# Patient Record
Sex: Female | Born: 1935 | Race: White | Hispanic: No | Smoking: Never smoker
Health system: Southern US, Community
[De-identification: ages and names within clinical notes are randomized; demographics above are authoritative.]

## PROBLEM LIST (undated history)

## (undated) DIAGNOSIS — I10 Essential (primary) hypertension: Secondary | ICD-10-CM

## (undated) HISTORY — PX: TONSILLECTOMY: SUR1361

## (undated) HISTORY — PX: ABDOMINAL HYSTERECTOMY: SHX81

## (undated) HISTORY — PX: CHOLECYSTECTOMY: SHX55

---

## 2004-03-21 ENCOUNTER — Other Ambulatory Visit: Payer: Self-pay

## 2004-08-14 ENCOUNTER — Ambulatory Visit: Payer: Self-pay | Admitting: Internal Medicine

## 2004-09-04 ENCOUNTER — Ambulatory Visit: Payer: Self-pay | Admitting: Anesthesiology

## 2004-09-11 ENCOUNTER — Ambulatory Visit: Payer: Self-pay | Admitting: Internal Medicine

## 2004-10-11 ENCOUNTER — Ambulatory Visit: Payer: Self-pay | Admitting: Internal Medicine

## 2004-11-11 ENCOUNTER — Ambulatory Visit: Payer: Self-pay | Admitting: Internal Medicine

## 2004-11-25 ENCOUNTER — Emergency Department: Payer: Self-pay | Admitting: Emergency Medicine

## 2004-12-17 ENCOUNTER — Ambulatory Visit: Payer: Self-pay | Admitting: Internal Medicine

## 2004-12-24 ENCOUNTER — Ambulatory Visit: Payer: Self-pay | Admitting: Anesthesiology

## 2005-01-22 ENCOUNTER — Ambulatory Visit: Payer: Self-pay | Admitting: Anesthesiology

## 2005-01-23 ENCOUNTER — Ambulatory Visit: Payer: Self-pay | Admitting: Internal Medicine

## 2005-02-20 ENCOUNTER — Ambulatory Visit: Payer: Self-pay | Admitting: Internal Medicine

## 2005-02-26 ENCOUNTER — Ambulatory Visit: Payer: Self-pay | Admitting: Anesthesiology

## 2005-03-20 ENCOUNTER — Ambulatory Visit: Payer: Self-pay | Admitting: Internal Medicine

## 2005-03-21 ENCOUNTER — Ambulatory Visit: Payer: Self-pay | Admitting: Anesthesiology

## 2005-04-11 ENCOUNTER — Ambulatory Visit: Payer: Self-pay | Admitting: Internal Medicine

## 2005-05-11 ENCOUNTER — Ambulatory Visit: Payer: Self-pay | Admitting: Internal Medicine

## 2005-06-12 ENCOUNTER — Ambulatory Visit: Payer: Self-pay | Admitting: Internal Medicine

## 2005-06-13 ENCOUNTER — Ambulatory Visit: Payer: Self-pay | Admitting: Anesthesiology

## 2005-06-15 ENCOUNTER — Emergency Department: Payer: Self-pay | Admitting: Emergency Medicine

## 2005-07-12 ENCOUNTER — Ambulatory Visit: Payer: Self-pay | Admitting: Internal Medicine

## 2005-08-06 ENCOUNTER — Inpatient Hospital Stay: Payer: Self-pay | Admitting: Internal Medicine

## 2005-08-29 ENCOUNTER — Ambulatory Visit: Payer: Self-pay | Admitting: Internal Medicine

## 2005-09-10 ENCOUNTER — Ambulatory Visit: Payer: Self-pay | Admitting: Anesthesiology

## 2005-09-11 ENCOUNTER — Ambulatory Visit: Payer: Self-pay | Admitting: Internal Medicine

## 2005-10-11 ENCOUNTER — Ambulatory Visit: Payer: Self-pay | Admitting: Internal Medicine

## 2005-11-20 ENCOUNTER — Ambulatory Visit: Payer: Self-pay | Admitting: Anesthesiology

## 2005-11-27 ENCOUNTER — Ambulatory Visit: Payer: Self-pay | Admitting: Internal Medicine

## 2005-12-01 ENCOUNTER — Emergency Department: Payer: Self-pay | Admitting: Internal Medicine

## 2005-12-01 ENCOUNTER — Other Ambulatory Visit: Payer: Self-pay

## 2005-12-12 ENCOUNTER — Ambulatory Visit: Payer: Self-pay | Admitting: Internal Medicine

## 2006-04-03 ENCOUNTER — Ambulatory Visit: Payer: Self-pay | Admitting: Anesthesiology

## 2006-04-04 ENCOUNTER — Ambulatory Visit: Payer: Self-pay | Admitting: Anesthesiology

## 2006-04-07 ENCOUNTER — Ambulatory Visit: Payer: Self-pay | Admitting: Internal Medicine

## 2006-04-11 ENCOUNTER — Ambulatory Visit: Payer: Self-pay | Admitting: Internal Medicine

## 2006-05-08 ENCOUNTER — Ambulatory Visit: Payer: Self-pay | Admitting: Anesthesiology

## 2006-05-11 ENCOUNTER — Ambulatory Visit: Payer: Self-pay | Admitting: Internal Medicine

## 2006-06-11 ENCOUNTER — Ambulatory Visit: Payer: Self-pay | Admitting: Internal Medicine

## 2006-06-29 ENCOUNTER — Other Ambulatory Visit: Payer: Self-pay

## 2006-06-29 ENCOUNTER — Inpatient Hospital Stay: Payer: Self-pay | Admitting: Internal Medicine

## 2006-07-10 ENCOUNTER — Ambulatory Visit: Payer: Self-pay | Admitting: Anesthesiology

## 2006-07-30 ENCOUNTER — Ambulatory Visit: Payer: Self-pay | Admitting: Internal Medicine

## 2006-08-12 ENCOUNTER — Ambulatory Visit: Payer: Self-pay | Admitting: Anesthesiology

## 2006-09-01 ENCOUNTER — Ambulatory Visit: Payer: Self-pay | Admitting: Internal Medicine

## 2006-09-02 ENCOUNTER — Ambulatory Visit: Payer: Self-pay | Admitting: Anesthesiology

## 2006-09-11 ENCOUNTER — Ambulatory Visit: Payer: Self-pay | Admitting: Internal Medicine

## 2006-10-06 ENCOUNTER — Ambulatory Visit: Payer: Self-pay | Admitting: Anesthesiology

## 2006-10-22 ENCOUNTER — Ambulatory Visit: Payer: Self-pay | Admitting: Internal Medicine

## 2006-11-19 ENCOUNTER — Ambulatory Visit: Payer: Self-pay | Admitting: Internal Medicine

## 2006-11-20 ENCOUNTER — Ambulatory Visit: Payer: Self-pay | Admitting: Anesthesiology

## 2006-12-11 ENCOUNTER — Ambulatory Visit: Payer: Self-pay | Admitting: Anesthesiology

## 2006-12-12 ENCOUNTER — Ambulatory Visit: Payer: Self-pay | Admitting: Internal Medicine

## 2007-01-10 ENCOUNTER — Ambulatory Visit: Payer: Self-pay | Admitting: Internal Medicine

## 2007-02-18 ENCOUNTER — Ambulatory Visit: Payer: Self-pay | Admitting: Internal Medicine

## 2007-03-05 ENCOUNTER — Ambulatory Visit: Payer: Self-pay | Admitting: Anesthesiology

## 2007-03-05 ENCOUNTER — Emergency Department: Payer: Self-pay | Admitting: Emergency Medicine

## 2007-03-24 ENCOUNTER — Ambulatory Visit: Payer: Self-pay | Admitting: Podiatry

## 2007-03-25 ENCOUNTER — Ambulatory Visit: Payer: Self-pay | Admitting: Internal Medicine

## 2007-04-15 ENCOUNTER — Ambulatory Visit: Payer: Self-pay | Admitting: Internal Medicine

## 2007-05-08 ENCOUNTER — Ambulatory Visit: Payer: Self-pay | Admitting: Gastroenterology

## 2007-05-12 ENCOUNTER — Ambulatory Visit: Payer: Self-pay | Admitting: Internal Medicine

## 2007-06-12 ENCOUNTER — Ambulatory Visit: Payer: Self-pay | Admitting: Internal Medicine

## 2007-07-13 ENCOUNTER — Ambulatory Visit: Payer: Self-pay | Admitting: Internal Medicine

## 2007-08-17 ENCOUNTER — Ambulatory Visit: Payer: Self-pay | Admitting: Anesthesiology

## 2007-08-28 ENCOUNTER — Ambulatory Visit: Payer: Self-pay | Admitting: Internal Medicine

## 2007-09-12 ENCOUNTER — Ambulatory Visit: Payer: Self-pay | Admitting: Internal Medicine

## 2007-10-12 ENCOUNTER — Ambulatory Visit: Payer: Self-pay | Admitting: Internal Medicine

## 2007-10-28 ENCOUNTER — Ambulatory Visit: Payer: Self-pay | Admitting: Anesthesiology

## 2007-11-12 ENCOUNTER — Ambulatory Visit: Payer: Self-pay | Admitting: Internal Medicine

## 2007-11-25 ENCOUNTER — Ambulatory Visit: Payer: Self-pay | Admitting: Internal Medicine

## 2007-12-13 ENCOUNTER — Ambulatory Visit: Payer: Self-pay | Admitting: Internal Medicine

## 2007-12-29 ENCOUNTER — Ambulatory Visit: Payer: Self-pay | Admitting: Anesthesiology

## 2008-01-10 ENCOUNTER — Ambulatory Visit: Payer: Self-pay | Admitting: Internal Medicine

## 2008-02-19 ENCOUNTER — Ambulatory Visit: Payer: Self-pay | Admitting: Family Medicine

## 2008-02-20 ENCOUNTER — Ambulatory Visit: Payer: Self-pay | Admitting: Family Medicine

## 2008-02-24 ENCOUNTER — Ambulatory Visit: Payer: Self-pay | Admitting: Internal Medicine

## 2008-03-09 ENCOUNTER — Other Ambulatory Visit: Payer: Self-pay

## 2008-03-09 ENCOUNTER — Ambulatory Visit: Payer: Self-pay | Admitting: Ophthalmology

## 2008-03-14 ENCOUNTER — Ambulatory Visit: Payer: Self-pay | Admitting: Ophthalmology

## 2008-03-23 ENCOUNTER — Ambulatory Visit: Payer: Self-pay | Admitting: Internal Medicine

## 2008-04-06 ENCOUNTER — Ambulatory Visit: Payer: Self-pay | Admitting: Ophthalmology

## 2008-04-18 ENCOUNTER — Ambulatory Visit: Payer: Self-pay | Admitting: Ophthalmology

## 2008-05-03 ENCOUNTER — Ambulatory Visit: Payer: Self-pay | Admitting: Internal Medicine

## 2008-05-11 ENCOUNTER — Ambulatory Visit: Payer: Self-pay | Admitting: Internal Medicine

## 2008-06-11 ENCOUNTER — Ambulatory Visit: Payer: Self-pay | Admitting: Internal Medicine

## 2008-06-13 ENCOUNTER — Ambulatory Visit: Payer: Self-pay | Admitting: Anesthesiology

## 2008-07-06 ENCOUNTER — Ambulatory Visit: Payer: Self-pay | Admitting: Internal Medicine

## 2008-07-22 ENCOUNTER — Ambulatory Visit: Payer: Self-pay | Admitting: Anesthesiology

## 2008-08-03 ENCOUNTER — Ambulatory Visit: Payer: Self-pay | Admitting: Internal Medicine

## 2008-08-11 ENCOUNTER — Ambulatory Visit: Payer: Self-pay | Admitting: Internal Medicine

## 2008-09-11 ENCOUNTER — Ambulatory Visit: Payer: Self-pay | Admitting: Internal Medicine

## 2008-10-22 ENCOUNTER — Emergency Department: Payer: Self-pay | Admitting: Emergency Medicine

## 2008-11-02 ENCOUNTER — Ambulatory Visit: Payer: Self-pay | Admitting: Internal Medicine

## 2008-11-11 ENCOUNTER — Ambulatory Visit: Payer: Self-pay | Admitting: Internal Medicine

## 2008-11-23 ENCOUNTER — Ambulatory Visit: Payer: Self-pay | Admitting: Internal Medicine

## 2008-11-25 ENCOUNTER — Ambulatory Visit: Payer: Self-pay | Admitting: Anesthesiology

## 2008-12-12 ENCOUNTER — Ambulatory Visit: Payer: Self-pay | Admitting: Internal Medicine

## 2009-01-09 ENCOUNTER — Ambulatory Visit: Payer: Self-pay | Admitting: Internal Medicine

## 2009-02-15 ENCOUNTER — Ambulatory Visit: Payer: Self-pay | Admitting: Internal Medicine

## 2009-02-17 ENCOUNTER — Ambulatory Visit: Payer: Self-pay | Admitting: Urology

## 2009-02-17 ENCOUNTER — Encounter: Payer: Self-pay | Admitting: Gastroenterology

## 2009-03-11 ENCOUNTER — Ambulatory Visit: Payer: Self-pay | Admitting: Internal Medicine

## 2009-03-29 ENCOUNTER — Ambulatory Visit: Payer: Self-pay | Admitting: Internal Medicine

## 2009-04-05 ENCOUNTER — Ambulatory Visit: Payer: Self-pay | Admitting: Anesthesiology

## 2009-04-07 ENCOUNTER — Encounter: Payer: Self-pay | Admitting: Gastroenterology

## 2009-04-11 ENCOUNTER — Ambulatory Visit: Payer: Self-pay | Admitting: Internal Medicine

## 2009-04-18 ENCOUNTER — Encounter: Payer: Self-pay | Admitting: Gastroenterology

## 2009-04-18 ENCOUNTER — Telehealth (INDEPENDENT_AMBULATORY_CARE_PROVIDER_SITE_OTHER): Payer: Self-pay | Admitting: *Deleted

## 2009-04-18 DIAGNOSIS — R933 Abnormal findings on diagnostic imaging of other parts of digestive tract: Secondary | ICD-10-CM | POA: Insufficient documentation

## 2009-05-04 ENCOUNTER — Ambulatory Visit: Payer: Self-pay | Admitting: Gastroenterology

## 2009-05-04 ENCOUNTER — Ambulatory Visit (HOSPITAL_COMMUNITY): Admission: RE | Admit: 2009-05-04 | Discharge: 2009-05-04 | Payer: Self-pay | Admitting: Gastroenterology

## 2009-05-17 ENCOUNTER — Ambulatory Visit: Payer: Self-pay | Admitting: Internal Medicine

## 2009-06-06 ENCOUNTER — Ambulatory Visit: Payer: Self-pay | Admitting: Gastroenterology

## 2009-06-11 ENCOUNTER — Ambulatory Visit: Payer: Self-pay | Admitting: Internal Medicine

## 2009-06-13 ENCOUNTER — Inpatient Hospital Stay: Payer: Self-pay | Admitting: Internal Medicine

## 2009-06-22 ENCOUNTER — Inpatient Hospital Stay: Payer: Self-pay | Admitting: Internal Medicine

## 2009-07-07 ENCOUNTER — Ambulatory Visit: Payer: Self-pay | Admitting: Family Medicine

## 2009-07-18 ENCOUNTER — Encounter: Admission: RE | Admit: 2009-07-18 | Discharge: 2009-07-18 | Payer: Self-pay | Admitting: Gastroenterology

## 2009-07-26 ENCOUNTER — Inpatient Hospital Stay (HOSPITAL_COMMUNITY): Admission: RE | Admit: 2009-07-26 | Discharge: 2009-07-28 | Payer: Self-pay | Admitting: Gastroenterology

## 2010-11-19 ENCOUNTER — Inpatient Hospital Stay: Payer: Self-pay | Admitting: Surgery

## 2010-12-13 ENCOUNTER — Ambulatory Visit: Payer: Self-pay | Admitting: Surgery

## 2011-02-15 LAB — COMPREHENSIVE METABOLIC PANEL
ALT: 9 U/L (ref 0–35)
Alkaline Phosphatase: 57 U/L (ref 39–117)
Alkaline Phosphatase: 73 U/L (ref 39–117)
BUN: 3 mg/dL — ABNORMAL LOW (ref 6–23)
BUN: 10 mg/dL (ref 6–23)
BUN: 12 mg/dL (ref 6–23)
CO2: 24 mEq/L (ref 19–32)
Calcium: 8.5 mg/dL (ref 8.4–10.5)
Calcium: 8 mg/dL — ABNORMAL LOW (ref 8.4–10.5)
Chloride: 100 mEq/L (ref 96–112)
Creatinine, Ser: 0.53 mg/dL (ref 0.4–1.2)
GFR calc non Af Amer: >60 mL/min (ref >60)
Glucose, Bld: 106 mg/dL — ABNORMAL HIGH (ref 70–99)
Glucose, Bld: 102 mg/dL — ABNORMAL HIGH (ref 70–99)
Glucose, Bld: 116 mg/dL — ABNORMAL HIGH (ref 70–99)
Potassium: 3.7 mEq/L (ref 3.5–5.1)
Potassium: 4.4 mEq/L (ref 3.5–5.1)
Sodium: 135 mEq/L (ref 135–145)
Total Bilirubin: 0.6 mg/dL (ref 0.3–1.2)
Total Protein: 5.2 g/dL — ABNORMAL LOW (ref 6.0–8.3)

## 2011-02-15 LAB — LIPASE, BLOOD
Lipase: 132 U/L — ABNORMAL HIGH (ref 11–59)
Lipase: 825 U/L — ABNORMAL HIGH (ref 11–59)

## 2011-02-15 LAB — BASIC METABOLIC PANEL
BUN: 4 mg/dL — ABNORMAL LOW (ref 6–23)
GFR calc Af Amer: >60 mL/min (ref >60)
GFR calc non Af Amer: >60 mL/min (ref >60)
Potassium: 3.9 mEq/L (ref 3.5–5.1)

## 2011-02-15 LAB — CBC
HCT: 31.4 % — ABNORMAL LOW (ref 36.0–46.0)
HCT: 31.9 % — ABNORMAL LOW (ref 36.0–46.0)
HCT: 32.7 % — ABNORMAL LOW (ref 36.0–46.0)
HCT: 37.7 % (ref 36.0–46.0)
Hemoglobin: 10.7 g/dL — ABNORMAL LOW (ref 12.0–15.0)
Hemoglobin: 11.1 g/dL — ABNORMAL LOW (ref 12.0–15.0)
Hemoglobin: 12.6 g/dL (ref 12.0–15.0)
MCHC: 33.4 g/dL (ref 30.0–36.0)
MCHC: 33.8 g/dL (ref 30.0–36.0)
MCV: 95.5 fL (ref 78.0–100.0)
Platelets: 236 10*3/uL (ref 150–400)
RBC: 3.34 MIL/uL — ABNORMAL LOW (ref 3.87–5.11)
RDW: 14.6 % (ref 11.5–15.5)
WBC: 9.6 10*3/uL (ref 4.0–10.5)
WBC: 5.9 10*3/uL (ref 4.0–10.5)

## 2011-02-15 LAB — PROTIME-INR
INR: 0.9 (ref 0.00–1.49)
Prothrombin Time: 11.9 seconds (ref 11.6–15.2)

## 2011-03-27 ENCOUNTER — Ambulatory Visit: Payer: Self-pay | Admitting: Anesthesiology

## 2011-07-01 ENCOUNTER — Ambulatory Visit: Payer: Self-pay | Admitting: Anesthesiology

## 2011-07-29 ENCOUNTER — Ambulatory Visit: Payer: Self-pay | Admitting: Anesthesiology

## 2011-09-11 ENCOUNTER — Ambulatory Visit: Payer: Self-pay | Admitting: Gastroenterology

## 2011-10-07 ENCOUNTER — Ambulatory Visit: Payer: Self-pay | Admitting: Anesthesiology

## 2012-07-11 ENCOUNTER — Ambulatory Visit: Payer: Self-pay | Admitting: Internal Medicine

## 2012-07-15 ENCOUNTER — Other Ambulatory Visit: Payer: Self-pay | Admitting: Podiatry

## 2012-07-19 LAB — WOUND CULTURE

## 2012-10-19 ENCOUNTER — Ambulatory Visit: Payer: Self-pay

## 2013-05-18 ENCOUNTER — Ambulatory Visit: Payer: Self-pay | Admitting: Pain Medicine

## 2013-05-26 ENCOUNTER — Ambulatory Visit: Payer: Self-pay | Admitting: Pain Medicine

## 2013-06-10 ENCOUNTER — Ambulatory Visit: Payer: Self-pay | Admitting: Pain Medicine

## 2013-06-22 ENCOUNTER — Ambulatory Visit: Payer: Self-pay | Admitting: Pain Medicine

## 2013-07-05 ENCOUNTER — Ambulatory Visit: Payer: Self-pay | Admitting: Pain Medicine

## 2013-08-03 ENCOUNTER — Ambulatory Visit: Payer: Self-pay | Admitting: Pain Medicine

## 2013-08-18 ENCOUNTER — Ambulatory Visit: Payer: Self-pay | Admitting: Pain Medicine

## 2013-09-07 ENCOUNTER — Ambulatory Visit: Payer: Self-pay | Admitting: Pain Medicine

## 2013-09-15 ENCOUNTER — Ambulatory Visit: Payer: Self-pay | Admitting: Pain Medicine

## 2013-10-05 ENCOUNTER — Ambulatory Visit: Payer: Self-pay | Admitting: Pain Medicine

## 2013-12-07 ENCOUNTER — Ambulatory Visit: Payer: Self-pay | Admitting: Pain Medicine

## 2013-12-22 ENCOUNTER — Ambulatory Visit: Payer: Self-pay | Admitting: Pain Medicine

## 2015-11-27 ENCOUNTER — Emergency Department: Payer: Medicare Other

## 2015-11-27 ENCOUNTER — Inpatient Hospital Stay
Admission: EM | Admit: 2015-11-27 | Discharge: 2015-11-29 | DRG: 065 | Disposition: A | Discharge status: Skilled Nursing Facility | Payer: Medicare Other | Attending: Specialist | Admitting: Specialist

## 2015-11-27 ENCOUNTER — Inpatient Hospital Stay: Payer: Medicare Other

## 2015-11-27 ENCOUNTER — Ambulatory Visit (INDEPENDENT_AMBULATORY_CARE_PROVIDER_SITE_OTHER)
Admission: EM | Admit: 2015-11-27 | Discharge: 2015-11-27 | Disposition: A | Discharge status: Other Acute Inpatient Hospital | Payer: Medicare Other | Source: Home / Self Care

## 2015-11-27 ENCOUNTER — Encounter: Payer: Self-pay | Admitting: Emergency Medicine

## 2015-11-27 ENCOUNTER — Inpatient Hospital Stay
Admit: 2015-11-27 | Discharge: 2015-11-27 | Disposition: A | Discharge status: Home / Self Care | Payer: Medicare Other | Attending: Internal Medicine | Admitting: Internal Medicine

## 2015-11-27 DIAGNOSIS — Z79899 Other long term (current) drug therapy: Secondary | ICD-10-CM | POA: Diagnosis not present

## 2015-11-27 DIAGNOSIS — Z90710 Acquired absence of both cervix and uterus: Secondary | ICD-10-CM | POA: Diagnosis not present

## 2015-11-27 DIAGNOSIS — Z8249 Family history of ischemic heart disease and other diseases of the circulatory system: Secondary | ICD-10-CM | POA: Diagnosis not present

## 2015-11-27 DIAGNOSIS — E875 Hyperkalemia: Secondary | ICD-10-CM | POA: Diagnosis present

## 2015-11-27 DIAGNOSIS — Z880 Allergy status to penicillin: Secondary | ICD-10-CM

## 2015-11-27 DIAGNOSIS — Z9889 Other specified postprocedural states: Secondary | ICD-10-CM | POA: Diagnosis not present

## 2015-11-27 DIAGNOSIS — M069 Rheumatoid arthritis, unspecified: Secondary | ICD-10-CM | POA: Diagnosis not present

## 2015-11-27 DIAGNOSIS — E785 Hyperlipidemia, unspecified: Secondary | ICD-10-CM | POA: Diagnosis not present

## 2015-11-27 DIAGNOSIS — Z23 Encounter for immunization: Secondary | ICD-10-CM

## 2015-11-27 DIAGNOSIS — I63519 Cerebral infarction due to unspecified occlusion or stenosis of unspecified middle cerebral artery: Secondary | ICD-10-CM | POA: Diagnosis present

## 2015-11-27 DIAGNOSIS — E860 Dehydration: Secondary | ICD-10-CM | POA: Diagnosis present

## 2015-11-27 DIAGNOSIS — F419 Anxiety disorder, unspecified: Secondary | ICD-10-CM | POA: Diagnosis present

## 2015-11-27 DIAGNOSIS — I639 Cerebral infarction, unspecified: Secondary | ICD-10-CM

## 2015-11-27 DIAGNOSIS — E871 Hypo-osmolality and hyponatremia: Secondary | ICD-10-CM | POA: Diagnosis present

## 2015-11-27 DIAGNOSIS — Z9049 Acquired absence of other specified parts of digestive tract: Secondary | ICD-10-CM | POA: Diagnosis not present

## 2015-11-27 DIAGNOSIS — R29818 Other symptoms and signs involving the nervous system: Secondary | ICD-10-CM | POA: Diagnosis not present

## 2015-11-27 DIAGNOSIS — R531 Weakness: Secondary | ICD-10-CM | POA: Diagnosis present

## 2015-11-27 DIAGNOSIS — R000 Tachycardia, unspecified: Secondary | ICD-10-CM | POA: Diagnosis not present

## 2015-11-27 DIAGNOSIS — I10 Essential (primary) hypertension: Secondary | ICD-10-CM | POA: Diagnosis present

## 2015-11-27 DIAGNOSIS — R29898 Other symptoms and signs involving the musculoskeletal system: Secondary | ICD-10-CM

## 2015-11-27 DIAGNOSIS — M810 Age-related osteoporosis without current pathological fracture: Secondary | ICD-10-CM | POA: Diagnosis present

## 2015-11-27 DIAGNOSIS — K219 Gastro-esophageal reflux disease without esophagitis: Secondary | ICD-10-CM | POA: Diagnosis present

## 2015-11-27 DIAGNOSIS — M6289 Other specified disorders of muscle: Secondary | ICD-10-CM | POA: Diagnosis present

## 2015-11-27 HISTORY — DX: Essential (primary) hypertension: I10

## 2015-11-27 LAB — URINALYSIS COMPLETE WITH MICROSCOPIC (ARMC ONLY)
Bilirubin Urine: NEGATIVE
Glucose, UA: NEGATIVE mg/dL
Hgb urine dipstick: NEGATIVE
Ketones, ur: NEGATIVE mg/dL
Nitrite: POSITIVE — AB
PROTEIN: NEGATIVE mg/dL
Specific Gravity, Urine: 1.015 (ref 1.005–1.030)
pH: 6 (ref 5.0–8.0)

## 2015-11-27 LAB — CBC
HEMATOCRIT: 39 % (ref 35.0–47.0)
HEMOGLOBIN: 12.9 g/dL (ref 12.0–16.0)
MCH: 30.8 pg (ref 26.0–34.0)
MCHC: 33 g/dL (ref 32.0–36.0)
MCV: 93.6 fL (ref 80.0–100.0)
Platelets: 266 10*3/uL (ref 150–440)
RBC: 4.17 MIL/uL (ref 3.80–5.20)
RDW: 13.2 % (ref 11.5–14.5)
WBC: 9.2 10*3/uL (ref 3.6–11.0)

## 2015-11-27 LAB — BASIC METABOLIC PANEL
ANION GAP: 7 (ref 5–15)
BUN: 20 mg/dL (ref 6–20)
CALCIUM: 8.9 mg/dL (ref 8.9–10.3)
CO2: 25 mmol/L (ref 22–32)
Chloride: 94 mmol/L — ABNORMAL LOW (ref 101–111)
Creatinine, Ser: 0.87 mg/dL (ref 0.44–1.00)
GFR calc Af Amer: >60 mL/min (ref >60)
GFR calc non Af Amer: >60 mL/min (ref >60)
GLUCOSE: 110 mg/dL — AB (ref 65–99)
Potassium: 5.5 mmol/L — ABNORMAL HIGH (ref 3.5–5.1)
Sodium: 126 mmol/L — ABNORMAL LOW (ref 135–145)

## 2015-11-27 MED ORDER — PNEUMOCOCCAL VAC POLYVALENT 25 MCG/0.5ML IJ INJ
0.5000 mL | INJECTION | INTRAMUSCULAR | Status: AC
  Administered 2015-11-28: 0.5 mL via INTRAMUSCULAR
  Filled 2015-11-27: qty 0.5

## 2015-11-27 MED ORDER — ONDANSETRON HCL 4 MG PO TABS: 4.0000 mg | ORAL_TABLET | Freq: Four times a day (QID) | ORAL | Status: DC | PRN

## 2015-11-27 MED ORDER — ALPRAZOLAM 0.25 MG PO TABS
0.2500 mg | ORAL_TABLET | Freq: Every day | ORAL | Status: DC | PRN
  Administered 2015-11-27 – 2015-11-28 (×2): 0.25 mg via ORAL
  Filled 2015-11-27 (×3): qty 1

## 2015-11-27 MED ORDER — NYSTATIN 100000 UNIT/GM EX POWD
Freq: Two times a day (BID) | CUTANEOUS | Status: DC
  Administered 2015-11-27 – 2015-11-29 (×4): via TOPICAL
  Filled 2015-11-27: qty 15

## 2015-11-27 MED ORDER — ONDANSETRON HCL 4 MG/2ML IJ SOLN: 4.0000 mg | Freq: Four times a day (QID) | INTRAMUSCULAR | Status: DC | PRN

## 2015-11-27 MED ORDER — ACETAMINOPHEN 650 MG RE SUPP: 650.0000 mg | Freq: Four times a day (QID) | RECTAL | Status: DC | PRN

## 2015-11-27 MED ORDER — ASPIRIN 81 MG PO CHEW
81.0000 mg | CHEWABLE_TABLET | Freq: Every day | ORAL | Status: DC
  Administered 2015-11-27 – 2015-11-29 (×3): 81 mg via ORAL
  Filled 2015-11-27 (×3): qty 1

## 2015-11-27 MED ORDER — ACETAMINOPHEN 325 MG PO TABS
650.0000 mg | ORAL_TABLET | Freq: Four times a day (QID) | ORAL | Status: DC | PRN
  Administered 2015-11-27 – 2015-11-28 (×4): 650 mg via ORAL
  Filled 2015-11-27 (×5): qty 2

## 2015-11-27 MED ORDER — PANTOPRAZOLE SODIUM 40 MG PO TBEC
40.0000 mg | DELAYED_RELEASE_TABLET | Freq: Every day | ORAL | Status: DC
  Administered 2015-11-27 – 2015-11-29 (×3): 40 mg via ORAL
  Filled 2015-11-27 (×3): qty 1

## 2015-11-27 MED ORDER — HEPARIN SODIUM (PORCINE) 5000 UNIT/ML IJ SOLN
5000.0000 [IU] | Freq: Three times a day (TID) | INTRAMUSCULAR | Status: DC
  Administered 2015-11-27 – 2015-11-28 (×4): 5000 [IU] via SUBCUTANEOUS
  Filled 2015-11-27 (×3): qty 1

## 2015-11-27 MED ORDER — ZOLPIDEM TARTRATE 5 MG PO TABS
5.0000 mg | ORAL_TABLET | Freq: Every day | ORAL | Status: DC
  Administered 2015-11-27 – 2015-11-28 (×2): 5 mg via ORAL
  Filled 2015-11-27 (×3): qty 1

## 2015-11-27 MED ORDER — SODIUM CHLORIDE 0.9 % IV SOLN
INTRAVENOUS | Status: DC
  Administered 2015-11-27 – 2015-11-28 (×2): via INTRAVENOUS

## 2015-11-27 MED ORDER — SODIUM POLYSTYRENE SULFONATE 15 GM/60ML PO SUSP
30.0000 g | Freq: Once | ORAL | Status: AC
  Administered 2015-11-27: 30 g via ORAL
  Filled 2015-11-27: qty 120

## 2015-11-27 MED ORDER — METOCLOPRAMIDE HCL 10 MG PO TABS
10.0000 mg | ORAL_TABLET | Freq: Three times a day (TID) | ORAL | Status: DC
  Administered 2015-11-27 – 2015-11-29 (×7): 10 mg via ORAL
  Filled 2015-11-27 (×7): qty 1

## 2015-11-27 MED ORDER — NITROGLYCERIN 0.4 MG SL SUBL: 0.4000 mg | SUBLINGUAL_TABLET | SUBLINGUAL | Status: DC | PRN

## 2015-11-27 NOTE — Progress Notes (Signed)
*  PRELIMINARY RESULTS* Echocardiogram 2D Echocardiogram has been performed.  Erica Villa 11/27/2015, 8:03 PM

## 2015-11-27 NOTE — H&P (Signed)
[facility] [place] [facility]   PATIENT NAME: Erica Villa    MR#:  [id number]  DATE OF BIRTH:  1932-05-01  DATE OF ADMISSION:  11/27/2015  PRIMARY CARE PHYSICIAN: No primary care provider on file.   REQUESTING/REFERRING PHYSICIAN: Dr. Huel Cote  CHIEF COMPLAINT: Right-sided weakness    Chief Complaint  Patient presents with  . Numbness    HISTORY OF PRESENT ILLNESS:  Erica Villa  is a 82 y.o. female with a known history of hypertension brought in by family secondary to right-sided weakness started yesterday morning. Patient to noted to have right-sided weakness mainly in the upper extremity and lower extremity continued today also. No slurred speech. No headache or double vision. No weakness  o n the left side. Earlier had numbness on the right side but resolved. Noted to have hyponatremia. PAST MEDICAL HISTORY:   Past Medical History  Diagnosis Date  . Hypertension    history of rheumatoid arthritis.  PAST SURGICAL HISTOIRY:   Past Surgical History  Procedure Laterality Date  . Abdominal hysterectomy    . Tonsillectomy    . Cholecystectomy      SOCIAL HISTORY:   Social History  Substance Use Topics  . Smoking status: Never Smoker   . Smokeless tobacco: Never Used  . Alcohol Use: No    FAMILY HISTORY:  No family history on file. Has history of hypertension in brother and sister. DRUG ALLERGIES:   Allergies  Allergen Reactions  . Penicillins Anaphylaxis and Other (See Comments)    Has patient had a PCN reaction causing immediate rash, facial/tongue/throat swelling, SOB or lightheadedness with hypotension: Yes Has patient had a PCN reaction causing severe rash involving mucus membranes or skin necrosis: No Has patient had a PCN reaction that required hospitalization No Has patient had a PCN reaction occurring within the last 10 years: No If all of the above answers are "NO", then may proceed with Cephalosporin use.    REVIEW OF  SYSTEMS:  CONSTITUTIONAL: No fever, fatigue or weakness.  EYES: No blurred or double vision.  EARS, NOSE, AND THROAT: No tinnitus or ear pain.  RESPIRATORY: No cough, shortness of breath, wheezing or hemoptysis.  CARDIOVASCULAR: No chest pain, orthopnea, edema.  GASTROINTESTINAL: No nausea, vomiting, diarrhea or abdominal pain.  GENITOURINARY: No dysuria, hematuria.  ENDOCRINE: No polyuria, nocturia,  HEMATOLOGY: No anemia, easy bruising or bleeding SKIN: No rash or lesion. MUSCULOSKELETAL:Right-sided weakness.  NEUROLOGIC: No tingling, numbness, weakness.  PSYCHIATRY: No anxiety or depression.   MEDICATIONS AT HOME:   Prior to Admission medications   Medication Sig Start Date End Date Taking? Authorizing Provider  ALPRAZolam Prudy Feeler) 0.25 MG tablet Take 0.25 mg by mouth daily as needed for anxiety.    Yes Historical Provider, MD  denosumab (PROLIA) 60 MG/ML SOLN injection Inject 60 mg into the skin every 6 (six) months.    Yes Historical Provider, MD  isosorbide mononitrate (IMDUR) 30 MG 24 hr tablet Take 30 mg by mouth daily.   Yes Historical Provider, MD  metoCLOPramide (REGLAN) 10 MG tablet Take 10 mg by mouth 4 (four) times daily -  before meals and at bedtime.    Yes Historical Provider, MD  metoprolol succinate (TOPROL-XL) 100 MG 24 hr tablet Take 100 mg by mouth 2 (two) times daily.   Yes Historical Provider, MD  nitroGLYCERIN (NITROSTAT) 0.4 MG SL tablet Place 0.4 mg under the tongue every 5 (five) minutes as needed for chest pain.   Yes Historical Provider, MD  Vitamin D, Ergocalciferol, (DRISDOL) 50000 units CAPS capsule Take 50,000 Units by mouth every 7 (seven) days. Pt takes on Wednesday.   Yes Historical Provider, MD  zolpidem (AMBIEN) 5 MG tablet Take 5 mg by mouth at bedtime.   Yes Historical Provider, MD      VITAL SIGNS:  Height 5\' 6"  (1.676 m), weight 61.236 kg (135 lb).  PHYSICAL EXAMINATION:  GENERAL:  82 y.o.-year-old patient lying in the bed with no acute  distress.  EYES: Pupils equal, round, reactive to light and accommodation. No scleral icterus. Extraocular muscles intact.  HEENT: Head atraumatic, normocephalic. Oropharynx and nasopharynx clear.  NECK:  Supple, no jugular venous distention. No thyroid enlargement, no tenderness.  LUNGS: Normal breath sounds bilaterally, no wheezing, rales,rhonchi or crepitation. No use of accessory muscles of respiration.  CARDIOVASCULAR: S1, S2 normal. No murmurs, rubs, or gallops.  ABDOMEN: Soft, nontender, nondistended. Bowel sounds present. No organomegaly or mass.  EXTREMITIES: No pedal edema, cyanosis, or clubbing.  NEUROLOGIC: PSYCHIATRIC: The patient is alert and oriented x 3.  SKIN: No obvious rash, lesion, or ulcer.   LABORATORY PANEL:   CBC  Recent Labs Lab 11/27/15 1223  WBC 9.2  HGB 12.9  HCT 39.0  PLT 266   ------------------------------------------------------------------------------------------------------------------  Chemistries   Recent Labs Lab 11/27/15 1223  NA 126*  K 5.5*  CL 94*  CO2 25  GLUCOSE 110*  BUN 20  CREATININE 0.87  CALCIUM 8.9   ------------------------------------------------------------------------------------------------------------------  Cardiac Enzymes No results for input(s): TROPONINI in the last 168 hours. ------------------------------------------------------------------------------------------------------------------  RADIOLOGY:  Ct Head Wo Contrast  11/27/2015  CLINICAL DATA:  Right hand weakness and numbness since yesterday. EXAM: CT HEAD WITHOUT CONTRAST TECHNIQUE: Contiguous axial images were obtained from the base of the skull through the vertex without intravenous contrast. COMPARISON:  MRI brain 06/14/2009 FINDINGS: Stable age related cerebral atrophy, ventriculomegaly and periventricular white matter disease. No extra-axial fluid collections are identified. No CT findings for acute hemispheric infarction or intracranial  hemorrhage. No mass lesions. The brainstem and cerebellum are normal. The bony structures are intact. No fracture or bone lesion. The paranasal sinuses and mastoid air cells are clear. The globes are intact. Stable vascular calcifications. IMPRESSION: Age related cerebral atrophy, ventriculomegaly and periventricular white matter disease. No acute intracranial findings or mass lesions. Electronically Signed   By: 08/14/2009 M.D.   On: 11/27/2015 13:00    EKG:   Orders placed or performed during the hospital encounter of 11/27/15  . EKG 12-Lead  . EKG 12-Lead  . ED EKG  . ED EKG    IMPRESSION AND PLAN:   #1 right-sided weakness concerning for possible stroke or TIA: Start on aspirin, obtain fasting lipids, physical therapy evaluation, stroke workup including MRI of the brain, carotid ultrasound, echocardiogram. Also check neuro checks. #2 hyponatremia: Likely due to dehydration, patient lost her one of the daughters and had a funeral yesterday maybe she is going through some stress and dehydration: Start IV hydration. #3 history of rheumatoid arthritis: Stable. #4:mild hyperkalemia; unknown if she takes any potassium at home: give Kayexalate, recheck potassium tomorrow. 5., DVT prophylaxis. GI prophylaxis.   All the records are reviewed and case discussed with ED provider. Management plans discussed with the patient, family and they are in agreement.  CODE STATUS: full  TOTAL TIME TAKING CARE OF THIS PATIENT: 11/29/15.    M.D on 11/27/2015 at 2:01 PM  Between 7am to 6pm - Pager - [phone]  After 6pm go to [email or web address] -  password [email or web address] [facility]  [facility]  Office  [phone]  CC: Primary care physician; No primary care provider on file.  Note: This dictation was prepared with Dragon dictation along with smaller phrase technology. Any transcriptional errors that result from this process are unintentional.

## 2015-11-27 NOTE — Progress Notes (Signed)
Dr Lynne Leader powder topical BID

## 2015-11-27 NOTE — ED Notes (Signed)
Patient c/o right hand numbness and tingling that started yesterday.  Patient denies headache.  Patient denies chest pain or shortness of breath.  Patient denies N/V.  Patient denies injuring her hand.

## 2015-11-27 NOTE — ED Provider Notes (Signed)
Time Seen: Approximately 1220 I have reviewed the triage notes  Chief Complaint: Numbness   History of Present Illness: Erica Villa is a 82 y.o. female *who presents with right upper extremity weakness and clumsiness with difficulty writing at home. Patient's also had some noticeable right lower extremity weakness which the family noted yesterday. Patient states her symptoms started yesterday without any headaches, trouble with speech, trouble swallowing. She denies any chest pain, shortness of breath, or abdominal pain. She is right-handed and has had difficulty with numbness and in writing at home. Patient states occasional difficulty focusing but otherwise no visual field deficits  Past Medical History  Diagnosis Date  . Hypertension     Patient Active Problem List   Diagnosis Date Noted  . Right sided weakness 11/27/2015  . NONSPECIFIC ABN FINDING RAD & OTH EXAM GI TRACT 04/18/2009    Past Surgical History  Procedure Laterality Date  . Abdominal hysterectomy    . Tonsillectomy    . Cholecystectomy      Past Surgical History  Procedure Laterality Date  . Abdominal hysterectomy    . Tonsillectomy    . Cholecystectomy      Current Outpatient Rx  Name  Route  Sig  Dispense  Refill  . acetaminophen (TYLENOL) 500 MG tablet   Oral   Take 500 mg by mouth daily at 12 noon.         Marland Kitchen ALPRAZolam (XANAX) 0.25 MG tablet   Oral   Take 0.25 mg by mouth daily as needed for anxiety.          Marland Kitchen denosumab (PROLIA) 60 MG/ML SOLN injection   Subcutaneous   Inject 60 mg into the skin every 6 (six) months.          . isosorbide mononitrate (IMDUR) 30 MG 24 hr tablet   Oral   Take 30 mg by mouth daily.         . metoCLOPramide (REGLAN) 10 MG tablet   Oral   Take 10 mg by mouth 4 (four) times daily -  before meals and at bedtime.          . metoprolol succinate (TOPROL-XL) 100 MG 24 hr tablet   Oral   Take 100 mg by mouth 2 (two) times daily.         . naproxen  sodium (ANAPROX) 220 MG tablet   Oral   Take 220 mg by mouth 2 (two) times daily.         . nitroGLYCERIN (NITROSTAT) 0.4 MG SL tablet   Sublingual   Place 0.4 mg under the tongue every 5 (five) minutes as needed for chest pain.         . Vitamin D, Ergocalciferol, (DRISDOL) 50000 units CAPS capsule   Oral   Take 50,000 Units by mouth every 7 (seven) days. Pt takes on Wednesday.         . zolpidem (AMBIEN) 5 MG tablet   Oral   Take 5 mg by mouth at bedtime.           Allergies:  Penicillins  Family History: No family history on file.  Social History: Social History  Substance Use Topics  . Smoking status: Never Smoker   . Smokeless tobacco: Never Used  . Alcohol Use: No     Review of Systems:   10 point review of systems was performed and was otherwise negative:  Constitutional: No fever Eyes: No visual disturbances ENT: No sore throat, ear  pain Cardiac: No chest pain Respiratory: No shortness of breath, wheezing, or stridor Abdomen: No abdominal pain, no vomiting, No diarrhea Endocrine: No weight loss, No night sweats Extremities: No peripheral edema, cyanosis Skin: No rashes, easy bruising Neurologic: Focal weakness closely to the right upper and lower extremity trouble with speech or swollowing Urologic: No dysuria, Hematuria, or urinary frequency   Physical Exam:  ED Triage Vitals  Enc Vitals Group     BP 11/27/15 1300 180/95 mmHg     Pulse Rate 11/27/15 1300 71     Resp 11/27/15 1300 14     Temp --      Temp src --      SpO2 11/27/15 1300 97 %     Weight 11/27/15 1300 135 lb (61.236 kg)     Height 11/27/15 1300 5\' 6"  (1.676 m)     Head Cir --      Peak Flow --      Pain Score --      Pain Loc --      Pain Edu? --      Excl. in GC? --     General: Awake , Alert , and Oriented times 3; GCS 15 Head: Normal cephalic , atraumatic facial weakness Eyes: Pupils equal , round, reactive to light Nose/Throat: No nasal drainage, patent upper  airway without erythema or exudate.  Neck: Supple, Full range of motion, No anterior adenopathy or palpable thyroid masses Lungs: Clear to ascultation without wheezes , rhonchi, or rales Heart: Regular rate, regular rhythm without murmurs , gallops , or rubs Abdomen: Soft, non tender without rebound, guarding , or rigidity; bowel sounds positive and symmetric in all 4 quadrants. No organomegaly .        Extremities: 2 plus symmetric pulses. No edema, clubbing or cyanosis Neurologic: Patient has 4 out of 5 strength in the right upper extremity and 3 out of 5 strength in the right lower extremity. Compared to the left which shows 5 out of 5 strength in both left upper and left lower extremity Skin: warm, dry, no rashes   Labs:   All laboratory work was reviewed including any pertinent negatives or positives listed below:  Labs Reviewed  BASIC METABOLIC PANEL - Abnormal; Notable for the following:    Sodium 126 (*)    Potassium 5.5 (*)    Chloride 94 (*)    Glucose, Bld 110 (*)    All other components within normal limits  URINALYSIS COMPLETEWITH MICROSCOPIC ([facility] ONLY) - Abnormal; Notable for the following:    Color, Urine YELLOW (*)    APPearance HAZY (*)    Nitrite POSITIVE (*)    Leukocytes, UA 3+ (*)    Bacteria, UA RARE (*)    Squamous Epithelial / LPF 0-5 (*)    All other components within normal limits  CBC  CBG MONITORING, ED   reviewed the patient's laboratory work showed no significant findings  EKG: * ED ECG REPORT I, , the attending physician, personally viewed and interpreted this ECG.  Date: 11/27/2015 EKG Time: 1220 Rate: 76 Rhythm: normal sinus rhythm QRS Axis: normal Intervals: normal ST/T Wave abnormalities: normal Conduction Disutrbances: none Narrative Interpretation: unremarkable Poor R-wave progression but no acute ischemic changes   Radiology:   EXAM: CT HEAD WITHOUT CONTRAST  TECHNIQUE: Contiguous axial images were obtained  from the base of the skull through the vertex without intravenous contrast.  COMPARISON: MRI brain 06/14/2009  FINDINGS: Stable age related cerebral atrophy, ventriculomegaly  and periventricular white matter disease. No extra-axial fluid collections are identified. No CT findings for acute hemispheric infarction or intracranial hemorrhage. No mass lesions. The brainstem and cerebellum are normal.  The bony structures are intact. No fracture or bone lesion. The paranasal sinuses and mastoid air cells are clear. The globes are intact. Stable vascular calcifications.  IMPRESSION: Age related cerebral atrophy, ventriculomegaly and periventricular white matter disease. No acute intracranial findings or mass lesions.   I personally reviewed the radiologic studies     ED Course: * Patient's stay here was uneventful and she does not appear to have any signs of a hemorrhagic stroke per CAT scan evaluation. Clinically it was felt that she had a small stroke which left her with persistent right upper and lower extremity weakness. Patient has never been evaluated for acute cerebrovascular accident in the past. Patient was started on aspirin therapy here most likely require MRI, carotid Doppler, and echocardiogram evaluation. She is clearly outside the window for TPA or transfer for stroke intervention.    Assessment: * Acute cerebrovascular accident   Final Clinical Impression:  Final diagnoses:  Cerebral infarction due to unspecified mechanism     Plan:  Inpatient management I spoke to the hospitalist team, further disposition and management depends upon their evaluation           Jennye Moccasin, MD 11/27/15 1625

## 2015-11-27 NOTE — Discharge Instructions (Signed)
Hypotonia Hypotonia is decreased muscle tone. Muscle tone is the amount of tension or resistance to movement in a muscle while at rest. Muscle tone is different from muscle strength, which is how much force a muscle can apply. Often, a person with hypotonia will also have weak muscles. Hypotonia is often a symptom of a serious underlying condition or a problem at birth. Hypotonia can be a short-term condition, such as when a baby is born prematurely, or a lifelong (chronic) condition. Hypotonic infants are referred to as "floppy" infants because of their abnormally limp bodies and lack of control over their movements. CAUSES  In some cases, the cause of this condition is not known. Possible causes include:   Injury or damage (trauma). Hypotonia is often caused by trauma that occurred before, during, or right after birth (perinataltrauma).   Genetic disorders.  Nerve disorders.  Muscle disorders.  Central nervous system (CNS) disorders.  Problems in the connections between nerves and muscles (neuromuscular junctions).  Problems in the tissues that connect bones to one another (ligaments).  Serious infections.  Premature birth. SYMPTOMS  Symptoms of this condition vary based on the cause and the patient's age. The main symptoms include:   Low muscle tone in the entire body or in specific areas of the body.  Joints that are unusually flexible. This is often seen in the hips, elbows, and knees.  Poor reflexes.  Muscle weakness. Symptoms in Infants  Limp or "floppy" movements.  Little to no control of the neck muscles.  Inability to place weight on the limbs.  Limbs than hang straight down instead of bending at the joints.  Delayed development, especially with actions that involve the muscles (motor skills).  Poor sucking ability and poor weight gain.  Decreased alertness. Symptoms in Older Children and Adults  Weight loss.   Difficulty getting up and reaching for  objects.  Double vision.   Decreased energy.   Poor posture and balance. Depending on the cause, symptoms may improve, stay the same, or get worse over time. DIAGNOSIS  This condition is often diagnosed during infancy, but sometimes it can develop in older children and adults. Diagnosis is based on a physical exam and medical history. You may have tests, including:   MRI.  CT scan.  Electromyogram with nerve conduction studies (EMG with NCS). This evaluates the function of the nerves, neuromuscular junctions, and muscles.   Electroencephalogram (EEG). This records brain activity.  Removal of a small number of muscle or nerve cells that are examined under a microscope (biopsy).  Genetic testing. You may be given the name of a health care provider who specializes in CNS disorders (neurologist). TREATMENT  Treatment for hypotonia depends on the cause. Treatment options include:   Treating the underlying condition that causes your hypotonia, if this applies.  Physical therapy to improve motor skills and functional strength.  Occupational therapy to improve skills needed for everyday activities, such as fine motor skills (dexterity).  Speech-language therapy to overcome difficulty swallowing and talking. HOME CARE INSTRUCTIONS  Take over-the-counter and prescription medicines only as told by your health care provider.  Pay attention to any changes in your symptoms.  Use equipment, such as braces or wheelchairs, as told by your health care provider.  If you feel weak or unstable, sit or lie down right away.  Ask your health care provider what activities are safe for you. You may be told to avoid certain physical activities.  Ask for help when you need it, such  as when lifting heavy objects or reaching for things.  Keep all follow-up visits as told by your health care provider. This is important. SEEK MEDICAL CARE IF:  You have side effects from any medicines you are  taking.  Your symptoms suddenly change or get worse.  You have unusual stiffness.  You have a sudden change in mood or behavior.  You feel hopeless or depressed. SEEK IMMEDIATE MEDICAL CARE IF:  You have difficulty breathing.  You have difficulty seeing or your vision changes.  You have numbness in part of your body.   This information is not intended to replace advice given to you by your health care provider. Make sure you discuss any questions you have with your health care provider.   Document Released: 10/18/2002 Document Revised: 07/19/2015 Document Reviewed: 03/15/2015 Elsevier Interactive Patient Education [employer]

## 2015-11-27 NOTE — ED Provider Notes (Signed)
CSN: [id number]     Arrival date & time 11/27/15  1118 History   None   Nurses notes were reviewed. Chief Complaint  Patient presents with  . Numbness   Patient reports weakness in her right hand. According to the daughter the weakness started yesterday she called her yesterday morning to tell about the weakness of her right hand. Patient reports unable to use her hand to write her daughter and note and to also eat and she's been dropping her fork and unable to feed herself the right hand.   (Consider location/radiation/quality/duration/timing/severity/associated sxs/prior Treatment) Patient is a 82 y.o. female presenting with Acute Neurological Problem. The history is provided by the patient and a relative. No language interpreter was used.  Cerebrovascular Accident This is a new problem. The current episode started yesterday. The problem occurs constantly. The problem has not changed since onset.Pertinent negatives include no chest pain, no abdominal pain, no headaches and no shortness of breath. Nothing aggravates the symptoms. Nothing relieves the symptoms. The treatment provided no relief.    Past Medical History  Diagnosis Date  . Hypertension    Past Surgical History  Procedure Laterality Date  . Abdominal hysterectomy    . Tonsillectomy    . Cholecystectomy     History reviewed. No pertinent family history. Social History  Substance Use Topics  . Smoking status: Never Smoker   . Smokeless tobacco: Never Used  . Alcohol Use: No   OB History    No data available     Review of Systems  Respiratory: Negative for shortness of breath.   Cardiovascular: Negative for chest pain.  Gastrointestinal: Negative for abdominal pain.  Neurological: Negative for headaches.    Allergies  Review of patient's allergies indicates no known allergies.  Home Medications   Prior to Admission medications   Not on File   Meds Ordered and Administered this Visit  Medications - No  data to display  BP 169/82 mmHg  Pulse 73  Temp(Src) 98.3 F (36.8 C) (Oral)  Resp 16  SpO2 96% No data found.   Physical Exam  Constitutional: She is oriented to person, place, and time. She appears well-developed.  HENT:  Head: Normocephalic and atraumatic.  Eyes: Pupils are equal, round, and reactive to light.  Neck: Neck supple. No tracheal deviation present.  Cardiovascular: Normal rate, regular rhythm and normal heart sounds.   Pulmonary/Chest: Effort normal and breath sounds normal. No respiratory distress.  Musculoskeletal: She exhibits no edema or tenderness.       Right hand: She exhibits decreased range of motion. Decreased strength noted.       Hands: Neurological: She is alert and oriented to person, place, and time. She exhibits normal muscle tone.  Skin: Skin is warm and dry.  Psychiatric: She has a normal mood and affect. Her behavior is normal.  Vitals reviewed.   ED Course  Procedures (including critical care time)  Labs Review Labs Reviewed - No data to display  Imaging Review No results found.   Visual Acuity Review  Right Eye Distance:   Left Eye Distance:   Bilateral Distance:    Right Eye Near:   Left Eye Near:    Bilateral Near:         MDM   1. Weakness of right hand   2. Neurologic abnormality    Patient with a definite weakness of the right hand and right side. I've explained to them that we will need to send her to the  ED of her choice for possible admission and further evaluation of a suspicious CVA. Daughter rectal request [facility] and she feels comfortable taking her mother. Since this started yesterday and agreed with that. We'll contact him see and discussed with charge nurse Tammy Sours about the impending transfer. Her blood pressure is elevated and did inform the charge nurse of that.    Hassan Rowan, MD 11/27/15 [phone]

## 2015-11-27 NOTE — ED Notes (Signed)
Pt presents with right hand weakness and numbness started yesterday, was unable to write with her right hand yesterday and continues today.

## 2015-11-27 NOTE — ED Notes (Signed)
Pt at MRI at this time.

## 2015-11-27 NOTE — ED Notes (Signed)
Dr. Thurmond Butts called report to [facility] charge nurse at [facility] ED.

## 2015-11-27 NOTE — ED Notes (Signed)
States she noticed some numbness to right hand/arm yesterday ..the numbness cont's today denies any extremitry weakness but daughter did noticed some weakness on right side   Speech is clear

## 2015-11-27 NOTE — ED Notes (Signed)
Pt transported by Marylu Lund, NT, floor notified.

## 2015-11-27 NOTE — ED Notes (Signed)
Pt has been and to bathroom several times with some assistance.  Taken to u/s via stretcher

## 2015-11-27 NOTE — ED Notes (Signed)
Pt ready transfer to floor

## 2015-11-27 NOTE — ED Notes (Signed)
Pt back from MRI 

## 2015-11-28 DIAGNOSIS — I63519 Cerebral infarction due to unspecified occlusion or stenosis of unspecified middle cerebral artery: Secondary | ICD-10-CM | POA: Diagnosis not present

## 2015-11-28 DIAGNOSIS — I639 Cerebral infarction, unspecified: Secondary | ICD-10-CM

## 2015-11-28 LAB — BASIC METABOLIC PANEL
ANION GAP: 8 (ref 5–15)
BUN: 13 mg/dL (ref 6–20)
CHLORIDE: 95 mmol/L — AB (ref 101–111)
CO2: 29 mmol/L (ref 22–32)
Calcium: 8.5 mg/dL — ABNORMAL LOW (ref 8.9–10.3)
Creatinine, Ser: 0.78 mg/dL (ref 0.44–1.00)
Glucose, Bld: 97 mg/dL (ref 65–99)
POTASSIUM: 3.4 mmol/L — AB (ref 3.5–5.1)
SODIUM: 132 mmol/L — AB (ref 135–145)

## 2015-11-28 LAB — HEMOGLOBIN A1C: Hgb A1c MFr Bld: 5.1 % (ref 4.0–6.0)

## 2015-11-28 LAB — CBC
HEMATOCRIT: 38.1 % (ref 35.0–47.0)
HEMOGLOBIN: 12.7 g/dL (ref 12.0–16.0)
MCH: 31.1 pg (ref 26.0–34.0)
MCHC: 33.2 g/dL (ref 32.0–36.0)
MCV: 93.7 fL (ref 80.0–100.0)
Platelets: 241 10*3/uL (ref 150–440)
RBC: 4.07 MIL/uL (ref 3.80–5.20)
RDW: 13.2 % (ref 11.5–14.5)
WBC: 7.8 10*3/uL (ref 3.6–11.0)

## 2015-11-28 LAB — LIPID PANEL
CHOL/HDL RATIO: 3.3 ratio
Cholesterol: 173 mg/dL (ref 0–200)
HDL: 52 mg/dL (ref >40)
LDL CALC: 101 mg/dL — AB (ref 0–99)
TRIGLYCERIDES: 102 mg/dL (ref <150)
VLDL: 20 mg/dL (ref 0–40)

## 2015-11-28 LAB — GLUCOSE, CAPILLARY: Glucose-Capillary: 96 mg/dL (ref 65–99)

## 2015-11-28 LAB — POCT CBG MONITORING: POCT Glucose (KUC): 96 mg/dL (ref 70–99)

## 2015-11-28 MED ORDER — ATORVASTATIN CALCIUM 20 MG PO TABS
40.0000 mg | ORAL_TABLET | Freq: Every day | ORAL | Status: DC
  Administered 2015-11-28: 17:00:00 40 mg via ORAL
  Filled 2015-11-28: qty 2

## 2015-11-28 MED ORDER — ENOXAPARIN SODIUM 40 MG/0.4ML ~~LOC~~ SOLN
40.0000 mg | SUBCUTANEOUS | Status: DC
Start: 2015-11-28 — End: 2015-11-29
  Administered 2015-11-28: 40 mg via SUBCUTANEOUS
  Filled 2015-11-28: qty 0.4

## 2015-11-28 MED ORDER — ENOXAPARIN SODIUM 40 MG/0.4ML ~~LOC~~ SOLN: 40.0000 mg | SUBCUTANEOUS | Status: DC

## 2015-11-28 MED ORDER — OXYCODONE HCL 5 MG PO TABS
5.0000 mg | ORAL_TABLET | ORAL | Status: DC | PRN
  Administered 2015-11-28 – 2015-11-29 (×2): 5 mg via ORAL
  Filled 2015-11-28 (×2): qty 1

## 2015-11-28 NOTE — Evaluation (Signed)
Physical Therapy Evaluation Patient Details Name: Erica Villa MRN: [id number] DOB: October 05, 1932 Today's Date: 11/28/2015   History of Present Illness  presented to ER secondary to R UE/LE weakness/numbness; admitted for acute CVA. MRI significant for small L MCA infarcts.  Clinical Impression  Upon evaluation, patient alert and oriented to basic information; follows simple commands, but demonstrates marked impairment in insight and overall safety awareness.  Noted deficits in R UE > LE strength, ROM, coordination, attention to R visual field/hemibody and sensory awareness which impair patient's safety and indep with all functional mobility, ADLs.  Unable to complete any mobility (sit/stand, basic transfers or gait x45') without RW and min assist +1 from therapist at all times; limited by significant fatigue with minimal activity.  Unable to tolerate additional gait distance or objective balance testing at this time.  Will continue to assess as appropriate. Would benefit from skilled PT to address above deficits and promote optimal return to PLOF; recommend transition to STR upon discharge from acute hospitalization, as patient unsafe to return home alone.     Follow Up Recommendations SNF    Equipment Recommendations  Rolling walker with 5" wheels    Recommendations for Other Services       Precautions / Restrictions Precautions Precautions: Fall Restrictions Weight Bearing Restrictions: No      Mobility  Bed Mobility Overal bed mobility: Modified Independent                Transfers Overall transfer level: Needs assistance Equipment used: Rolling walker (2 wheeled) Transfers: Sit to/from Stand Sit to Stand: Min assist         General transfer comment: cuing for hand placement and overall safety  Ambulation/Gait Ambulation/Gait assistance: Min assist Ambulation Distance (Feet): 45 Feet Assistive device: Rolling walker (2 wheeled)       General Gait Details:  inconsistent step height/length with very slow, guarded cadence and overall gait speed; poor stability with turns and obstacle negotiation (esp to R of midline) with high fall risk.  Notably fatiguged after minimal activity; unable to tolerate additional activity  Stairs            Wheelchair Mobility    Modified Rankin (Stroke Patients Only)       Balance Overall balance assessment: Needs assistance Sitting-balance support: No upper extremity supported;Feet supported Sitting balance-Leahy Scale: Fair Sitting balance - Comments: lists posteriorally with fatigue or divided attention, min cuing/assist from therapist for correction (though patient insists balance impairment due to "osteoporosis")   Standing balance support: Bilateral upper extremity supported Standing balance-Leahy Scale: Fair                               Pertinent Vitals/Pain Pain Assessment: No/denies pain    Home Living Family/patient expects to be discharged to:: Private residence Living Arrangements: Alone Available Help at Discharge: Family;Available PRN/intermittently (frequent check-in from children) Type of Home: House Home Access: Stairs to enter Entrance Stairs-Rails: Can reach both Entrance Stairs-Number of Steps: 1 Home Layout: One level Home Equipment: Cane - single point      Prior Function Level of Independence: Independent with assistive device(s)         Comments: "Furniture cruises" inside home, intermittent use of SPC outside of home; indep with ADLs and simple household activities.  Children provide frequent check in and assist with meals, community errands.  Denies fall history.     Hand Dominance   Dominant Hand: Right  Extremity/Trunk Assessment   Upper Extremity Assessment:  (R UE ROM grossly WFL, strength at least 4/5, decreased sensory awareness to light touch (no extinction) with mild/mod dysmetria and decreased speed of coordination/RAMPS)            Lower Extremity Assessment:  (R LE ROM grossly WFL, hip at least 3-/5, knee 4/5 and ankle 4/5.  Decreased light touch ankle distally (no extinction). Negative clonus)         Communication   Communication: No difficulties  Cognition Arousal/Alertness: Awake/alert Behavior During Therapy: WFL for tasks assessed/performed Overall Cognitive Status:  (Oriented to self, location, situation; follows simple commands.  Very limited insight/awareness into deficits and need for assist.  Limited STM)                      General Comments      Exercises Other Exercises Other Exercises: Visual scanning tasks suggestive of R inattention, though able to correct with verbal cuing--line bisection test with midline mark approx 25% from L of line; clock drawing WFL. Mild word-finding difficulty at times (esp with confrontational naming), but patient attributes to "my early dementia" Other Exercises: Educated on noted deficits (sensation, coordination, balance, mobility) and role of PT for intervention/recovery.      Assessment/Plan    PT Assessment Patient needs continued PT services  PT Diagnosis Difficulty walking;Abnormality of gait;Generalized weakness;Hemiplegia dominant side   PT Problem List Decreased strength;Decreased activity tolerance;Decreased knowledge of use of DME;Decreased balance;Decreased cognition;Decreased mobility;Decreased safety awareness;Decreased knowledge of precautions;Impaired sensation  PT Treatment Interventions DME instruction;Gait training;Stair training;Functional mobility training;Therapeutic activities;Therapeutic exercise;Balance training;Neuromuscular re-education;Cognitive remediation;Patient/family education   PT Goals (Current goals can be found in the Care Plan section) Acute Rehab PT Goals Patient Stated Goal: "for you to help me walk" PT Goal Formulation: With patient Time For Goal Achievement: 12/12/15 Potential to Achieve Goals: Good     Frequency 7X/week   Barriers to discharge Decreased caregiver support;Inaccessible home environment      Co-evaluation               End of Session Equipment Utilized During Treatment: Gait belt Activity Tolerance: Patient limited by fatigue Patient left: in chair;with call bell/phone within reach;with chair alarm set Nurse Communication: Mobility status         Time: 2409-7353 PT Time Calculation (min) (ACUTE ONLY): 26 min   Charges:   PT Evaluation $PT Eval Moderate Complexity: 1 Procedure PT Treatments $Neuromuscular Re-education: 8-22 mins   PT G Codes:        Damica Gravlin H. Manson Passey, PT, DPT, NCS 11/28/2015, 8:54 PM [phone]

## 2015-11-28 NOTE — Plan of Care (Addendum)
VSS, Afebrile. IVF infusing as ordered. NIH 2. MRI positive for acute infarct.   Problem: Self-Care: Goal: Ability to participate in self-care as condition permits will improve Outcome: Progressing +1 minimal assist BSC. Rest periods provided as needed.   Problem: Safety: Goal: Ability to remain free from injury will improve Outcome: Progressing High fall risk. Bed alarm on. Safe environment provided. Pt understands how to use call button for assistance.   Problem: Health Behavior/Discharge Planning: Goal: Ability to manage health-related needs will improve Outcome: Progressing Pt from home alone. Family very involved & supportive of care.   Problem: Pain Managment: Goal: General experience of comfort will improve Outcome: Progressing C/o headache relieved by PRN Tylenol.

## 2015-11-28 NOTE — Progress Notes (Signed)
[facility] [place] at [facility]   PATIENT NAME: Erica Villa    MR#:  [id number]  DATE OF BIRTH:  [birth year]  SUBJECTIVE:  CHIEF COMPLAINT:   Chief Complaint  Patient presents with  . Numbness   Pt. Here due to right hand numbness and weakness.  MRI does confirm and acute CVA.  Has a mild headache but no other complaints.    REVIEW OF SYSTEMS:    Review of Systems  Constitutional: Negative for fever and chills.  HENT: Negative for congestion and tinnitus.   Eyes: Negative for blurred vision and double vision.  Respiratory: Negative for cough, shortness of breath and wheezing.   Cardiovascular: Negative for chest pain, orthopnea and PND.  Gastrointestinal: Negative for nausea, vomiting, abdominal pain and diarrhea.  Genitourinary: Negative for dysuria and hematuria.  Neurological: Positive for sensory change (Right hand). Negative for dizziness and focal weakness.  All other systems reviewed and are negative.   Nutrition: Heart Healthy Tolerating Diet: Yes Tolerating PT: Await Eval.      DRUG ALLERGIES:   Allergies  Allergen Reactions  . Penicillins Anaphylaxis and Other (See Comments)    Has patient had a PCN reaction causing immediate rash, facial/tongue/throat swelling, SOB or lightheadedness with hypotension: Yes Has patient had a PCN reaction causing severe rash involving mucus membranes or skin necrosis: No Has patient had a PCN reaction that required hospitalization No Has patient had a PCN reaction occurring within the last 10 years: No If all of the above answers are "NO", then may proceed with Cephalosporin use.    VITALS:  Blood pressure 117/66, pulse 79, temperature 98.1 F (36.7 C), temperature source Oral, resp. rate 20, height 5\' 7"  (1.702 m), weight 68.72 kg (151 lb 8 oz), SpO2 97 %.  PHYSICAL EXAMINATION:   Physical Exam  GENERAL:  82 y.o.-year-old patient lying in the bed in NAD.  EYES: Pupils equal, round, reactive  to light and accommodation. No scleral icterus. Extraocular muscles intact.  HEENT: Head atraumatic, normocephalic. Oropharynx and nasopharynx clear.  NECK:  Supple, no jugular venous distention. No thyroid enlargement, no tenderness.  LUNGS: Normal breath sounds bilaterally, no wheezing, rales, rhonchi. No use of accessory muscles of respiration.  CARDIOVASCULAR: S1, S2 normal. No murmurs, rubs, or gallops.  ABDOMEN: Soft, nontender, nondistended. Bowel sounds present. No organomegaly or mass.  EXTREMITIES: No cyanosis, clubbing or edema b/l.    NEUROLOGIC: Cranial nerves II through XII are intact. Right Hand/arm weakness 4/5 strength.  No other focal motor or sensory deficits appreciated b/l.   PSYCHIATRIC: The patient is alert and oriented x 3.  SKIN: No obvious rash, lesion, or ulcer.    LABORATORY PANEL:   CBC  Recent Labs Lab 11/28/15 0452  WBC 7.8  HGB 12.7  HCT 38.1  PLT 241   ------------------------------------------------------------------------------------------------------------------  Chemistries   Recent Labs Lab 11/28/15 0452  NA 132*  K 3.4*  CL 95*  CO2 29  GLUCOSE 97  BUN 13  CREATININE 0.78  CALCIUM 8.5*   ------------------------------------------------------------------------------------------------------------------  Cardiac Enzymes No results for input(s): TROPONINI in the last 168 hours. ------------------------------------------------------------------------------------------------------------------  RADIOLOGY:  Ct Head Wo Contrast  11/27/2015  CLINICAL DATA:  Right hand weakness and numbness since yesterday. EXAM: CT HEAD WITHOUT CONTRAST TECHNIQUE: Contiguous axial images were obtained from the base of the skull through the vertex without intravenous contrast. COMPARISON:  MRI brain 06/14/2009 FINDINGS: Stable age related cerebral atrophy, ventriculomegaly and periventricular white matter disease. No extra-axial fluid collections  are  identified. No CT findings for acute hemispheric infarction or intracranial hemorrhage. No mass lesions. The brainstem and cerebellum are normal. The bony structures are intact. No fracture or bone lesion. The paranasal sinuses and mastoid air cells are clear. The globes are intact. Stable vascular calcifications. IMPRESSION: Age related cerebral atrophy, ventriculomegaly and periventricular white matter disease. No acute intracranial findings or mass lesions. Electronically Signed   By: Rudie Meyer M.D.   On: 11/27/2015 13:00   Mr Brain Wo Contrast  11/27/2015  CLINICAL DATA:  82 year old female with right side weakness. Right hand numbness. Generalized weakness. Symptoms for 1.5 days. Initial encounter. EXAM: MRI HEAD WITHOUT CONTRAST TECHNIQUE: Multiplanar, multiecho pulse sequences of the brain and surrounding structures were obtained without intravenous contrast. COMPARISON:  Head CT without contrast 1241 hours today. Brain MRI 06/14/2009. FINDINGS: Small foci of cortical and subcortical restricted diffusion along the superior lateral left peri-Rolandic gyrus (series 100, images 40 and 42). This is at the right upper extremity representation area. Mild associated T2 and FLAIR hyperintensity with no evidence of associated hemorrhage. No mass effect. No other left hemisphere restricted diffusion identified. No contralateral right side or posterior fossa restricted diffusion. Major intracranial vascular flow voids are stable since 2010. Cerebral volume is not significantly changed. No midline shift, mass effect, evidence of mass lesion, ventriculomegaly, extra-axial collection or acute intracranial hemorrhage. Cervicomedullary junction and pituitary are within normal limits. Negative for age visualized cervical spine. Scattered and patchy bilateral cerebral white matter T2 and FLAIR hyperintensity outside of the acute findings has mildly progressed since 2010. No cortical encephalomalacia identified. However,  chronic lacunar infarcts of the basal ganglia and thalami (left greater than right) have progressed. Brainstem and cerebellum remain normal for age. Mastoids and paranasal sinuses remain clear. Orbit and scalp soft tissues are stable. Normal bone marrow signal. IMPRESSION: 1. Small acute left MCA territory infarcts about the left motor strip in the area of right upper extremity representation. No associated hemorrhage or mass effect. 2. Progressed small vessel ischemia in the left deep gray matter nuclei since 2010. Electronically Signed   By: Odessa Fleming M.D.   On: 11/27/2015 15:59   US Carotid Bilateral  11/27/2015  CLINICAL DATA:  Right-sided weakness and hypertension. EXAM: BILATERAL CAROTID DUPLEX ULTRASOUND TECHNIQUE: Wallace Cullens scale imaging, color Doppler and duplex ultrasound were performed of bilateral carotid and vertebral arteries in the neck. COMPARISON:  None. FINDINGS: Criteria: Quantification of carotid stenosis is based on velocity parameters that correlate the residual internal carotid diameter with NASCET-based stenosis levels, using the diameter of the distal internal carotid lumen as the denominator for stenosis measurement. The following velocity measurements were obtained: RIGHT ICA:  56/16 cm/sec CCA:  50/6 cm/sec SYSTOLIC ICA/CCA RATIO:  1.1 DIASTOLIC ICA/CCA RATIO:  2.7 ECA:  56 cm/sec LEFT ICA:  40/9 cm/sec CCA:  50/10 cm/sec SYSTOLIC ICA/CCA RATIO:  0.8 DIASTOLIC ICA/CCA RATIO:  0.9 ECA:  48 cm/sec RIGHT CAROTID ARTERY: Focal calcified plaque is present at the level of the distal bulb and proximal ICA. Velocities and waveforms are normal and estimated right ICA stenosis is less than 50%. RIGHT VERTEBRAL ARTERY: Antegrade flow with normal waveform and velocity. LEFT CAROTID ARTERY: There is a mild amount of calcified plaque in the proximal left ICA. Velocities and waveforms are normal and estimated left ICA stenosis is less than 50%. LEFT VERTEBRAL ARTERY: Antegrade flow with normal waveform and  velocity. IMPRESSION: Mild amount of plaque bilaterally. Plaque burden at the level of the right distal  bulb and proximal right ICA slightly greater then in the left ICA. Estimated bilateral ICA stenoses are less than 50%. Electronically Signed   By: Irish Lack M.D.   On: 11/27/2015 15:53     ASSESSMENT AND PLAN:   82 year old female with past medical history of hypertension, rheumatoid arthritis, anxiety, osteoporosis who presented to the hospital due to right sided hand and arm numbness and weakness.  #1 acute CVA-this is the cause of patient's right hand numbness and weakness. Patient's MRI of the brain does show small acute left MCA territory infarct. -Appreciate neurology input patient was on no antiplatelet therapy prior to coming to the hospital. -Continue aspirin, will start on high intensity atorvastatin. -Await PT/OT evaluation. -Carotid Doppler is negative and so was a two-dimensional echocardiogram.  #2 hypertension-blood pressure on the normal side and will follow hemodynamics.  #3 hyperlipidemia-continue atorvastatin.  #4 GERD-continue Protonix.  #5 anxiety-continue as needed Xanax.   All the records are reviewed and case discussed with Care Management/Social Workerr. Management plans discussed with the patient, family and they are in agreement.  CODE STATUS: Full  DVT Prophylaxis: Lovenox  TOTAL TIME TAKING CARE OF THIS PATIENT: 30 minutes.   POSSIBLE D/C IN 1-2 DAYS, DEPENDING ON CLINICAL CONDITION.   Houston Siren M.D on 11/28/2015 at 3:15 PM  Between 7am to 6pm - Pager - [phone]  After 6pm go to [email or web address] - password [email or web address] [facility]  [facility]  Office  [phone]  CC: Primary care physician; No primary care provider on file.

## 2015-11-28 NOTE — Consult Note (Addendum)
Referring Physician: Cherlynn Kaiser    Chief Complaint: Right sided numbness  HPI: Erica Villa is an 82 y.o. female who reports that she awakened at baseline on 11/26/15.  That morning while writing a card to her daughter noted numbness in her right hand and that she was unable to hold the pen to write legibly. Patient was brought in the next day once symptoms did not resolve.    Date last known well: 11/26/2015 Time last known well: Time: 08:00 tPA Given: No: Outside time window  Past Medical History  Diagnosis Date  . Hypertension     Past Surgical History  Procedure Laterality Date  . Abdominal hysterectomy    . Tonsillectomy    . Cholecystectomy     Family history: Family history in brother and sister.  Social History:  reports that she has never smoked. She has never used smokeless tobacco. She reports that she does not drink alcohol. Her drug history is not on file.  Allergies:  Allergies  Allergen Reactions  . Penicillins Anaphylaxis and Other (See Comments)    Has patient had a PCN reaction causing immediate rash, facial/tongue/throat swelling, SOB or lightheadedness with hypotension: Yes Has patient had a PCN reaction causing severe rash involving mucus membranes or skin necrosis: No Has patient had a PCN reaction that required hospitalization No Has patient had a PCN reaction occurring within the last 10 years: No If all of the above answers are "NO", then may proceed with Cephalosporin use.    Medications:  I have reviewed the patient's current medications. Prior to Admission:  Prescriptions prior to admission  Medication Sig Dispense Refill Last Dose  . acetaminophen (TYLENOL) 500 MG tablet Take 500 mg by mouth daily at 12 noon.   11/26/2015 at 1200  . ALPRAZolam (XANAX) 0.25 MG tablet Take 0.25 mg by mouth daily as needed for anxiety.    11/26/2015 at Unknown time  . denosumab (PROLIA) 60 MG/ML SOLN injection Inject 60 mg into the skin every 6 (six) months.    05/12/2015  at unknown   . isosorbide mononitrate (IMDUR) 30 MG 24 hr tablet Take 30 mg by mouth daily.   11/26/2015 at Unknown time  . metoCLOPramide (REGLAN) 10 MG tablet Take 10 mg by mouth 4 (four) times daily -  before meals and at bedtime.    11/26/2015 at Unknown time  . metoprolol succinate (TOPROL-XL) 100 MG 24 hr tablet Take 100 mg by mouth 2 (two) times daily.   11/26/2015 at Unsure   . naproxen sodium (ANAPROX) 220 MG tablet Take 220 mg by mouth 2 (two) times daily.   11/27/2015 at 0900  . nitroGLYCERIN (NITROSTAT) 0.4 MG SL tablet Place 0.4 mg under the tongue every 5 (five) minutes as needed for chest pain.   PRN at PRN  . Vitamin D, Ergocalciferol, (DRISDOL) 50000 units CAPS capsule Take 50,000 Units by mouth every 7 (seven) days. Pt takes on Wednesday.   11/22/2015 at unknown  . zolpidem (AMBIEN) 5 MG tablet Take 5 mg by mouth at bedtime.   11/26/2015 at Unknown time   Scheduled: . aspirin  81 mg Oral Daily  . heparin  5,000 Units Subcutaneous 3 times per day  . metoCLOPramide  10 mg Oral TID AC & HS  . nystatin   Topical BID  . pantoprazole  40 mg Oral Daily  . zolpidem  5 mg Oral QHS    ROS: History obtained from the patient  General ROS: negative for - chills,  fatigue, fever, night sweats, weight gain or weight loss Psychological ROS: negative for - behavioral disorder, hallucinations, memory difficulties, mood swings or suicidal ideation Ophthalmic ROS: negative for - blurry vision, double vision, eye pain or loss of vision ENT ROS: negative for - epistaxis, nasal discharge, oral lesions, sore throat, tinnitus or vertigo Allergy and Immunology ROS: negative for - hives or itchy/watery eyes Hematological and Lymphatic ROS: negative for - bleeding problems, bruising or swollen lymph nodes Endocrine ROS: negative for - galactorrhea, hair pattern changes, polydipsia/polyuria or temperature intolerance Respiratory ROS: negative for - cough, hemoptysis, shortness of breath or  wheezing Cardiovascular ROS: lower extremity swelling Gastrointestinal ROS: negative for - abdominal pain, diarrhea, hematemesis, nausea/vomiting or stool incontinence Genito-Urinary ROS: negative for - dysuria, hematuria, incontinence or urinary frequency/urgency Musculoskeletal ROS: negative for - joint swelling or muscular weakness Neurological ROS: as noted in HPI Dermatological ROS: negative for rash and skin lesion changes  Physical Examination: Blood pressure 132/61, pulse 67, temperature 97.7 F (36.5 C), temperature source Oral, resp. rate 20, height 5\' 7"  (1.702 m), weight 68.72 kg (151 lb 8 oz), SpO2 97 %.  HEENT-  Normocephalic, no lesions, without obvious abnormality.  Normal external eye and conjunctiva.  Normal TM's bilaterally.  Normal auditory canals and external ears. Normal external nose, mucus membranes and septum.  Normal pharynx. Cardiovascular- S1, S2 normal, pulses palpable throughout   Lungs- chest clear, no wheezing, rales, normal symmetric air entry Abdomen- soft, non-tender; bowel sounds normal; no masses,  no organomegaly Extremities- BLE edema Lymph-no adenopathy palpable Musculoskeletal-no joint tenderness, deformity or swelling Skin-LE's erythmatous with bandage on RLE  Neurological Examination Mental Status: Alert, oriented, thought content appropriate.  Speech fluent without evidence of aphasia.  Able to follow 3 step commands without difficulty. Cranial Nerves: II: Discs flat bilaterally; Visual fields grossly normal, pupils equal, round, reactive to light and accommodation III,IV, VI: ptosis not present, extra-ocular motions intact bilaterally V,VII: decreased left NLF, facial light touch sensation normal bilaterally VIII: hearing normal bilaterally IX,X: gag reflex present XI: bilateral shoulder shrug XII: midline tongue extension Motor: Right : Upper extremity   5/5, hand grip 4/5    Left:     Upper extremity   5/5  Lower extremity    5-/5       Lower extremity   5/5 Tone and bulk:normal tone throughout; no atrophy noted Sensory: Pinprick and light touch decreased in the right hand Deep Tendon Reflexes: 2+ and symmetric in the upper extremities.  2+ at the left KJ, 1+ at the right KJ and absent at the ankles bilaterally Plantars: Right: downgoing   Left: downgoing Cerebellar: normal finger-to-nose and normal heel-to-shin testing bilaterally Gait: not tested due to safety concerns   Laboratory Studies:  Basic Metabolic Panel:  Recent Labs Lab 11/27/15 1223 11/28/15 0452  NA 126* 132*  K 5.5* 3.4*  CL 94* 95*  CO2 25 29  GLUCOSE 110* 97  BUN 20 13  CREATININE 0.87 0.78  CALCIUM 8.9 8.5*    Liver Function Tests: No results for input(s): AST, ALT, ALKPHOS, BILITOT, PROT, ALBUMIN in the last 168 hours. No results for input(s): LIPASE, AMYLASE in the last 168 hours. No results for input(s): AMMONIA in the last 168 hours.  CBC:  Recent Labs Lab 11/27/15 1223 11/28/15 0452  WBC 9.2 7.8  HGB 12.9 12.7  HCT 39.0 38.1  MCV 93.6 93.7  PLT 266 241    Cardiac Enzymes: No results for input(s): CKTOTAL, CKMB, CKMBINDEX, TROPONINI in the  last 168 hours.  BNP: Invalid input(s): POCBNP  CBG:  Recent Labs Lab 11/28/15 0732  GLUCAP 96    Microbiology: Results for orders placed or performed in visit on 07/15/12  Wound culture     Status: None   Collection Time: 07/15/12  3:30 PM  Result Value Ref Range Status   Micro Text Report   Final       SOURCE: RIGHT 2ND TOE    ORGANISM 1                MODERATE GROWTH STAPHYLOCOCCUS AUREUS   COMMENT                   WITH NORMAL SKIN FLORA   COMMENT                   NO ANAEROBES ISOLATED IN 4 DAYS   GRAM STAIN                MANY WHITE BLOOD CELLS   GRAM STAIN                MANY GRAM POSITIVE COCCI IN PAIRS IN CLUSTERS   ANTIBIOTIC                    ORG#1     CIPROFLOXACIN                 S         CLINDAMYCIN                   S          ERYTHROMYCIN                  S         GENTAMICIN                    S         LEVOFLOXACIN                  S         LINEZOLID                     S         OXACILLIN                     S         TIGECYCLINE                   S         CEFOXITIN SCREEN              NEGATIVE  INDUCIBLE CLINDAMYCIN RESISTANNEGATIVE  TRIMETHOPRIM/SULFAMETHOXAZOLE S             Coagulation Studies: No results for input(s): LABPROT, INR in the last 72 hours.  Urinalysis:  Recent Labs Lab 11/27/15 1158  COLORURINE YELLOW*  LABSPEC 1.015  PHURINE 6.0  GLUCOSEU NEGATIVE  HGBUR NEGATIVE  BILIRUBINUR NEGATIVE  KETONESUR NEGATIVE  PROTEINUR NEGATIVE  NITRITE POSITIVE*  LEUKOCYTESUR 3+*    Lipid Panel:    Component Value Date/Time   CHOL 173 11/28/2015 0452   TRIG 102 11/28/2015 0452   HDL 52 11/28/2015 0452   CHOLHDL 3.3 11/28/2015 0452   VLDL 20 11/28/2015 0452   LDLCALC 101* 11/28/2015 0452    HgbA1C: No results found for: HGBA1C  Urine Drug Screen:  No results found for: LABOPIA, COCAINSCRNUR, LABBENZ, AMPHETMU, THCU, LABBARB  Alcohol Level: No results for input(s): ETH in the last 168 hours.  Other results: EKG: normal sinus rhythm at 76 bpm.  Imaging: Ct Head Wo Contrast  11/27/2015  CLINICAL DATA:  Right hand weakness and numbness since yesterday. EXAM: CT HEAD WITHOUT CONTRAST TECHNIQUE: Contiguous axial images were obtained from the base of the skull through the vertex without intravenous contrast. COMPARISON:  MRI brain 06/14/2009 FINDINGS: Stable age related cerebral atrophy, ventriculomegaly and periventricular white matter disease. No extra-axial fluid collections are identified. No CT findings for acute hemispheric infarction or intracranial hemorrhage. No mass lesions. The brainstem and cerebellum are normal. The bony structures are intact. No fracture or bone lesion. The paranasal sinuses and mastoid air cells are clear. The globes are intact. Stable vascular  calcifications. IMPRESSION: Age related cerebral atrophy, ventriculomegaly and periventricular white matter disease. No acute intracranial findings or mass lesions. Electronically Signed   By: Rudie Meyer M.D.   On: 11/27/2015 13:00   Mr Brain Wo Contrast  11/27/2015  CLINICAL DATA:  82 year old female with right side weakness. Right hand numbness. Generalized weakness. Symptoms for 1.5 days. Initial encounter. EXAM: MRI HEAD WITHOUT CONTRAST TECHNIQUE: Multiplanar, multiecho pulse sequences of the brain and surrounding structures were obtained without intravenous contrast. COMPARISON:  Head CT without contrast 1241 hours today. Brain MRI 06/14/2009. FINDINGS: Small foci of cortical and subcortical restricted diffusion along the superior lateral left peri-Rolandic gyrus (series 100, images 40 and 42). This is at the right upper extremity representation area. Mild associated T2 and FLAIR hyperintensity with no evidence of associated hemorrhage. No mass effect. No other left hemisphere restricted diffusion identified. No contralateral right side or posterior fossa restricted diffusion. Major intracranial vascular flow voids are stable since 2010. Cerebral volume is not significantly changed. No midline shift, mass effect, evidence of mass lesion, ventriculomegaly, extra-axial collection or acute intracranial hemorrhage. Cervicomedullary junction and pituitary are within normal limits. Negative for age visualized cervical spine. Scattered and patchy bilateral cerebral white matter T2 and FLAIR hyperintensity outside of the acute findings has mildly progressed since 2010. No cortical encephalomalacia identified. However, chronic lacunar infarcts of the basal ganglia and thalami (left greater than right) have progressed. Brainstem and cerebellum remain normal for age. Mastoids and paranasal sinuses remain clear. Orbit and scalp soft tissues are stable. Normal bone marrow signal. IMPRESSION: 1. Small acute left MCA  territory infarcts about the left motor strip in the area of right upper extremity representation. No associated hemorrhage or mass effect. 2. Progressed small vessel ischemia in the left deep gray matter nuclei since 2010. Electronically Signed   By: Odessa Fleming M.D.   On: 11/27/2015 15:59   US Carotid Bilateral  11/27/2015  CLINICAL DATA:  Right-sided weakness and hypertension. EXAM: BILATERAL CAROTID DUPLEX ULTRASOUND TECHNIQUE: Wallace Cullens scale imaging, color Doppler and duplex ultrasound were performed of bilateral carotid and vertebral arteries in the neck. COMPARISON:  None. FINDINGS: Criteria: Quantification of carotid stenosis is based on velocity parameters that correlate the residual internal carotid diameter with NASCET-based stenosis levels, using the diameter of the distal internal carotid lumen as the denominator for stenosis measurement. The following velocity measurements were obtained: RIGHT ICA:  56/16 cm/sec CCA:  50/6 cm/sec SYSTOLIC ICA/CCA RATIO:  1.1 DIASTOLIC ICA/CCA RATIO:  2.7 ECA:  56 cm/sec LEFT ICA:  40/9 cm/sec CCA:  50/10 cm/sec SYSTOLIC ICA/CCA RATIO:  0.8 DIASTOLIC ICA/CCA RATIO:  0.9 ECA:  48 cm/sec RIGHT CAROTID  ARTERY: Focal calcified plaque is present at the level of the distal bulb and proximal ICA. Velocities and waveforms are normal and estimated right ICA stenosis is less than 50%. RIGHT VERTEBRAL ARTERY: Antegrade flow with normal waveform and velocity. LEFT CAROTID ARTERY: There is a mild amount of calcified plaque in the proximal left ICA. Velocities and waveforms are normal and estimated left ICA stenosis is less than 50%. LEFT VERTEBRAL ARTERY: Antegrade flow with normal waveform and velocity. IMPRESSION: Mild amount of plaque bilaterally. Plaque burden at the level of the right distal bulb and proximal right ICA slightly greater then in the left ICA. Estimated bilateral ICA stenoses are less than 50%. Electronically Signed   By: Irish Lack M.D.   On: 11/27/2015 15:53     Assessment: 82 y.o. female presenting with right hand numbness and weakness.  Patient with history of hypertension.  BP controlled.  Head CT personally reviewed and shows no acute changes.  MRI of the brain shows a small acute left MCA territory infarct.  Patient on no antiplatelet therapy at home.  Carotid doppler shows no intracardiac masses or thrombi.  Echocardiogram shows an EF of 60% and no intracardiac source of emboli.   LDL 101.    Stroke Risk Factors - hypertension  Plan: 1. HgbA1c 2. Statin therapy for elevated LDL 3. PT consult, OT consult, Speech consult 4. Agree with ASA for stroke prophylaxis 7. NPO until RN stroke swallow screen 8. Telemetry monitoring 9. Frequent neuro checks   Thana Farr, MD Neurology [phone] 11/28/2015, 10:19 AM

## 2015-11-28 NOTE — Progress Notes (Signed)
Stroke booklet given to pt.  Orson Ape, RN

## 2015-11-28 NOTE — Evaluation (Signed)
Occupational Therapy Evaluation Patient Details Name: Erica Villa MRN: [id number] DOB: 05-Sep-1932 Today's Date: 11/28/2015    History of Present Illness Pt is 82 y.o. female with hx of HTN who is presenting with right hand numbness and weakness.BP controlled. Head CT shows no acute changes, but MRI of the brain shows a small acute left MCA territory infarct. Patient on no antiplatelet therapy at home. Carotid doppler shows no intracardiac masses or thrombi. Echocardiogram shows an EF of 60% and no intracardiac source of emboli. She lives at home alone but has family in area to help with meals and check on her.   Clinical Impression   Pt seen in room for assessing ADLs and toileting skills using BSC with NSG present.  Pt had just tried to get up out of recliner to get into bed despite having chair alarm on and pt being aware that she needed to call for help before getting up. She is alert and oriented X3 but presents with decreased recall, problem solving, insight, and judgment skills which put her at risk for going home alone again.  She has decreased sensation to light touch and sharp/dull in ventral and dorsal aspect of hand up to mid aspect of forearm with impaired opposition skills and extra time needed for using R hand for gross grasp.  She also presents with decreased vision in uppper right and lower left quadrants and states she has glaucoma.  She requires minimal assist and cues to sit EOB to don/doff slipper socks and simulate pants over feet due to leaning backwards.  She does sponge bathing at home sitting edge of bath tub only and was completing this as well as dressing skills independently prior to admission.  She is wanting to go home but is willing to consider rehab at [facility] and wants to talk to her daughters and son about it further.  She has had family support to help with meals, grocery shopping, taking our garbage, and cleaning, etc. She would benefit from further OT to increase  fine motor skills of R UE and hand, problem solving, safety and endurance for functional mobility to prevent falls at home.  Rec SNF for continued rehab after discharge from hospital.    Follow Up Recommendations  SNF    Equipment Recommendations   (adapted utensils with built of handles and curved spoon, foam holder for pen and toothbrush)    Recommendations for Other Services       Precautions / Restrictions Precautions Precautions: Fall Restrictions Weight Bearing Restrictions: No      Mobility Bed Mobility                  Transfers                      Balance                                            ADL Overall ADL's : Needs assistance/impaired                                       General ADL Comments: Pt requires minimal assistance for grooming skills and mod assist for self feeding, min assist for LB dressing skills sitting EOB due to decreased balance and safety.  Pt  leans backwards sitting EOB and states it is from osteroporosis and has decreased insight into deficiits.  She was very tired after walking with PT and is at risk for falls due to this.     Vision Vision Assessment?: Vision impaired- to be further tested in functional context Eye Alignment: Within Functional Limits Alignment/Gaze Preference: Within Defined Limits Saccades: Within functional limits Convergence: Impaired (comment) Depth Perception: Overshoots   Perception     Praxis      Pertinent Vitals/Pain Pain Assessment: No/denies pain     Hand Dominance Right   Extremity/Trunk Assessment Upper Extremity Assessment Upper Extremity Assessment: RUE deficits/detail RUE Deficits / Details: impaired light touch and sharp/dull in R hand dorsal and ventral aspects with decreased fine motor skills, impaired 3 point pinch, lateral pinch and opposition skills with over shooting; AROM R UE to about 100 in shoulder flexion with decreased  IR/ER RUE Sensation: decreased light touch;decreased proprioception RUE Coordination: decreased fine motor;decreased gross motor   Lower Extremity Assessment Lower Extremity Assessment: Defer to PT evaluation       Communication Communication Communication: No difficulties   Cognition Arousal/Alertness: Awake/alert Behavior During Therapy: WFL for tasks assessed/performed Overall Cognitive Status: Within Functional Limits for tasks assessed                     General Comments       Exercises       Shoulder Instructions      Home Living Family/patient expects to be discharged to:: Private residence Living Arrangements: Alone Available Help at Discharge: Family (2 daughters, a son and son in law in the area who help with meals, taking our garbage, grocery shopping, etc) Type of Home: House             Bathroom Shower/Tub: Tub/shower unit (pt does sponge bathing only sitting on edge of tub) Shower/tub characteristics: Door Firefighter: Standard Bathroom Accessibility: No              Prior Functioning/Environment Level of Independence: Needs assistance    ADL's / Homemaking Assistance Needed: assistance for meals, grocery shopping, cleaning and taking out garbage        OT Diagnosis: Generalized weakness;Cognitive deficits;Hemiplegia dominant side;Paresis   OT Problem List: Decreased strength;Decreased range of motion;Decreased activity tolerance;Impaired sensation;Decreased cognition;Decreased coordination;Impaired balance (sitting and/or standing)   OT Treatment/Interventions:      OT Goals(Current goals can be found in the care plan section) Acute Rehab OT Goals Patient Stated Goal: "to use my R hand again to feed myself" OT Goal Formulation: With patient/family Time For Goal Achievement: 12/12/15 Potential to Achieve Goals: Good  OT Frequency:     Barriers to D/C:            Co-evaluation              End of Session  Equipment Utilized During Treatment:  (2 foam utensil holders given with room number on them and NSG updated) Nurse Communication:  (son in law asking for MD to talk to her family about rec for rehab to help decide with pt and not let her decide best decision)  Activity Tolerance: Patient limited by fatigue Patient left: in bed;with call bell/phone within reach;with bed alarm set;with family/visitor present (son in law Johnny present)   Time: 1600-1630 OT Time Calculation (min): 30 min Charges:  OT General Charges $OT Visit: 1 Procedure OT Evaluation $OT Eval Moderate Complexity: 1 Procedure OT Treatments $Self Care/Home Management : 8-22 mins  G-Codes:    Wofford,Susan 11/28/2015, 4:46 PM   Susanne Borders, OTR/L ascom [phone]

## 2015-11-29 DIAGNOSIS — R000 Tachycardia, unspecified: Secondary | ICD-10-CM

## 2015-11-29 LAB — BASIC METABOLIC PANEL
Anion gap: 5 (ref 5–15)
BUN: 9 mg/dL (ref 6–20)
CO2: 28 mmol/L (ref 22–32)
CREATININE: 0.68 mg/dL (ref 0.44–1.00)
Calcium: 8.5 mg/dL — ABNORMAL LOW (ref 8.9–10.3)
Chloride: 99 mmol/L — ABNORMAL LOW (ref 101–111)
GFR calc Af Amer: >60 mL/min (ref >60)
Glucose, Bld: 101 mg/dL — ABNORMAL HIGH (ref 65–99)
Potassium: 3.4 mmol/L — ABNORMAL LOW (ref 3.5–5.1)
SODIUM: 132 mmol/L — AB (ref 135–145)

## 2015-11-29 LAB — GLUCOSE, CAPILLARY: GLUCOSE-CAPILLARY: 101 mg/dL — AB (ref 65–99)

## 2015-11-29 MED ORDER — ALPRAZOLAM 0.25 MG PO TABS: 0.2500 mg | ORAL_TABLET | Freq: Every day | ORAL | Status: AC | PRN

## 2015-11-29 MED ORDER — METOPROLOL SUCCINATE ER 100 MG PO TB24
100.0000 mg | ORAL_TABLET | Freq: Two times a day (BID) | ORAL | Status: DC
  Administered 2015-11-29: 100 mg via ORAL
  Filled 2015-11-29 (×2): qty 1

## 2015-11-29 MED ORDER — ATORVASTATIN CALCIUM 40 MG PO TABS: 40.0000 mg | ORAL_TABLET | Freq: Every day | ORAL | Status: AC

## 2015-11-29 MED ORDER — ASPIRIN 81 MG PO CHEW: 81.0000 mg | CHEWABLE_TABLET | Freq: Every day | ORAL | Status: AC

## 2015-11-29 NOTE — Plan of Care (Signed)
Problem: Education: Goal: Knowledge of disease or condition will improve Outcome: Progressing Pt is alert and oriented. C/o headache, oxycodone 5 mg PO given with decreased effect. Standby assist to the [facility], urinates frequently. NIH of a 0 at this time. Patient still has a weak R hand grip. Family supportive of patient, at bedside in the evening.      Problem: Safety: Goal: Ability to remain free from injury will improve Outcome: Progressing Pt is a high fall risk, calls for assistance when needed.

## 2015-11-29 NOTE — Clinical Social Work Note (Signed)
Clinical Social Work Assessment  Patient Details  Name: Erica Villa MRN: [id number] Date of Birth: 16-Mar-1932  Date of referral:  11/29/15               Reason for consult:  Facility Placement                Permission sought to share information with:  Family Supports Permission granted to share information::  Yes, Verbal Permission Granted  Name::     daughters, Erica Villa and Erica Villa   Housing/Transportation Living arrangements for the past 2 months:  Single Family Home Source of Information:  Patient, Adult Children Patient Interpreter Needed:  None Criminal Activity/Legal Involvement Pertinent to Current Situation/Hospitalization:  No - Comment as needed Significant Relationships:  Adult Children Lives with:  Self Do you feel safe going back to the place where you live?  Yes Need for family participation in patient care:  Yes (Comment)  Care giving concerns:  Pt lives alone and needs a higher level of care.    Social Worker assessment / plan:  CSW met with pt to address consult for SNF as recommended by PT. CSW introduced herself and explained role of social work. CSW also explained the process of discharging to SNF. Pt stated that she wanted to go home. When asked who was going to care for her at home, she replied that her children would. CSW spoke with pt's daughters for further information. Per pt's daughters, they are in agreement for SNF for STR. Erica Villa spoke with pt to address discharge plan. CSW initiated bed search and followed up with bed offers. Pt was agreeable after speaking with Erica Villa. [facility] was chosen. Facility received discharge information and was ready to accept pt. RN to call report and pt's daughter, Erica Villa to provide transportation. CSW is singing off as no further needs identified.   Employment status:  Retired Nurse, adult PT Recommendations:  [place] / Referral to community resources:  [place]  Patient/Family's Response to care:  Pt's daughters were appreciative of CSW support.   Patient/Family's Understanding of and Emotional Response to Diagnosis, Current Treatment, and Prognosis:  Pt's daughters were in agreement that pt needed STR at SNF even though pt was reluctant.   Emotional Assessment Appearance:  Appears stated age Attitude/Demeanor/Rapport:  Apprehensive Affect (typically observed):  Adaptable Orientation:  Oriented to Self, Oriented to Place, Oriented to  Time, Oriented to Situation Alcohol / Substance use:  Never Used Psych involvement (Current and /or in the community):  No (Comment)  Discharge Needs  Concerns to be addressed:  Adjustment to Illness Readmission within the last 30 days:  No Current discharge risk:  Chronically ill Barriers to Discharge:  No Barriers Identified   Darden Dates, LCSW 11/29/2015, 4:28 PM

## 2015-11-29 NOTE — NC FL2 (Signed)
MEDICAID FL2 LEVEL OF CARE SCREENING TOOL     IDENTIFICATION  Patient Name: Erica Villa Birthdate: 22-Dec-1931 Sex: female Admission Date (Current Location): 11/27/2015  [place] and [place] Number:  Chiropodist and Address:  [facility], [address] [place], [place] [address]      Provider Number: [id number]  Attending Physician Name and Address:  Houston Siren, MD  Relative Name and Phone Number:       Current Level of Care: Hospital Recommended Level of Care: Skilled Nursing Facility Prior Approval Number:    Date Approved/Denied:   PASRR Number: [id number]  Discharge Plan: SNF    Current Diagnoses: Patient Active Problem List   Diagnosis Date Noted  . Right sided weakness 11/27/2015  . NONSPECIFIC ABN FINDING RAD & OTH EXAM GI TRACT 04/18/2009    Orientation RESPIRATION BLADDER Height & Weight    Self, Time, Situation, Place  Normal Continent 5\' 7"  (170.2 cm) 151 lbs.  BEHAVIORAL SYMPTOMS/MOOD NEUROLOGICAL BOWEL NUTRITION STATUS      Continent Diet (2 Grams Na, Thin Liquids)  AMBULATORY STATUS COMMUNICATION OF NEEDS Skin   Limited Assist Verbally Normal                       Personal Care Assistance Level of Assistance  Bathing, Feeding, Dressing, Total care Bathing Assistance: Limited assistance Feeding assistance: Limited assistance Dressing Assistance: Limited assistance Total Care Assistance: Limited assistance   Functional Limitations Info  Sight, Hearing, Speech Sight Info: Adequate Hearing Info: Adequate Speech Info: Adequate    SPECIAL CARE FACTORS FREQUENCY  PT (By licensed PT), OT (By licensed OT)     PT Frequency: 5 OT Frequency: 5            Contractures      Additional Factors Info  Code Status, Allergies Code Status Info: Full Code Allergies Info: Penicillins           Current Medications (11/29/2015):  This is the current hospital active medication list Current  Facility-Administered Medications  Medication Dose Route Frequency Provider Last Rate Last Dose  . acetaminophen (TYLENOL) tablet 650 mg  650 mg Oral Q6H PRN 12/01/2015, MD   650 mg at 11/28/15 1710   Or  . acetaminophen (TYLENOL) suppository 650 mg  650 mg Rectal Q6H PRN 11/30/15, MD      . ALPRAZolam Katha Hamming) tablet 0.25 mg  0.25 mg Oral Daily PRN Prudy Feeler, MD   0.25 mg at 11/28/15 2324  . aspirin chewable tablet 81 mg  81 mg Oral Daily 11/30/15, MD   81 mg at 11/29/15 0832  . atorvastatin (LIPITOR) tablet 40 mg  40 mg Oral q1800 12/01/15, MD   40 mg at 11/28/15 1650  . enoxaparin (LOVENOX) injection 40 mg  40 mg Subcutaneous Q24H 11/30/15, MD   40 mg at 11/28/15 2133  . metoCLOPramide (REGLAN) tablet 10 mg  10 mg Oral TID AC & HS 2134, MD   10 mg at 11/29/15 12/01/15  . nitroGLYCERIN (NITROSTAT) SL tablet 0.4 mg  0.4 mg Sublingual Q5 min PRN 1638, MD      . nystatin (MYCOSTATIN/NYSTOP) topical powder   Topical BID Wyatt Haste, MD      . ondansetron [facility]) tablet 4 mg  4 mg Oral Q6H PRN [facility], MD       Or  . ondansetron (ZOFRAN) injection 4 mg  4 mg Intravenous Q6H PRN Katha Hamming, MD      . oxyCODONE (Oxy IR/ROXICODONE) immediate release tablet 5 mg  5 mg Oral Q4H PRN Wyatt Haste, MD   5 mg at 11/29/15 0558  . pantoprazole (PROTONIX) EC tablet 40 mg  40 mg Oral Daily Katha Hamming, MD   40 mg at 11/29/15 1700  . zolpidem (AMBIEN) tablet 5 mg  5 mg Oral QHS Wyatt Haste, MD   5 mg at 11/28/15 2312     Discharge Medications: Please see discharge summary for a list of discharge medications.  Relevant Imaging Results:  Relevant Lab Results:   Additional Information SSN:  [id number]  Dede Query, LCSW

## 2015-11-29 NOTE — Progress Notes (Signed)
Dr. Cherlynn Kaiser order pt's home dose of Metoprolol and reviewed pt's EKG.  Pt ok to discharge to [facility].

## 2015-11-29 NOTE — Discharge Summary (Signed)
[facility] [place] at [facility]   PATIENT NAME: Erica Villa    MR#:  [id number]  DATE OF BIRTH:  [birth year]  DATE OF ADMISSION:  11/27/2015 ADMITTING PHYSICIAN: Katha Hamming, MD  DATE OF DISCHARGE: 11/29/2015  PRIMARY CARE PHYSICIAN: No primary care provider on file.    ADMISSION DIAGNOSIS:  Right sided weakness [M62.89] Cerebral infarction due to unspecified mechanism [I63.9]  DISCHARGE DIAGNOSIS:  Active Problems:   Right sided weakness Hypertension Hyperlipidemia GERD Anxiety.  SECONDARY DIAGNOSIS:   Past Medical History  Diagnosis Date  . Hypertension     HOSPITAL COURSE:   82 year old female with past medical history of hypertension, rheumatoid arthritis, anxiety, osteoporosis who presented to the hospital due to right sided hand and arm numbness and weakness.  #1 acute CVA-this is the cause of patient's right hand numbness and weakness. Patient's MRI of the brain does show small acute left MCA territory infarct. -Appreciate neurology input patient was on no antiplatelet therapy prior to coming to the hospital and has been started on ASA along with a Statin.   -Carotid Doppler is negative and so was a two-dimensional echocardiogram. -Patient was seen by physical therapy/occupational therapy and they recommend short term rehab and pt. Is being discharged there presently.    #2 hypertension- BP on a higher side today and will resume Metoprolol, Imdur upon discharge  #3 hyperlipidemia-patient has been initiated on high dose intensity statin. Atorvastatin.  #4 anxiety-continue as needed Xanax.  #5 Osteoporosis - cont. Denosumab.    DISCHARGE CONDITIONS:   Stable  CONSULTS OBTAINED:  Treatment Team:  Thana Farr, MD  DRUG ALLERGIES:   Allergies  Allergen Reactions  . Penicillins Anaphylaxis and Other (See Comments)    Has patient had a PCN reaction causing immediate rash, facial/tongue/throat swelling, SOB or  lightheadedness with hypotension: Yes Has patient had a PCN reaction causing severe rash involving mucus membranes or skin necrosis: No Has patient had a PCN reaction that required hospitalization No Has patient had a PCN reaction occurring within the last 10 years: No If all of the above answers are "NO", then may proceed with Cephalosporin use.    DISCHARGE MEDICATIONS:   Current Discharge Medication List    START taking these medications   Details  aspirin 81 MG chewable tablet Chew 1 tablet (81 mg total) by mouth daily.    atorvastatin (LIPITOR) 40 MG tablet Take 1 tablet (40 mg total) by mouth daily at 6 PM.      CONTINUE these medications which have CHANGED   Details  !! ALPRAZolam (XANAX) 0.25 MG tablet Take 1 tablet (0.25 mg total) by mouth daily as needed for anxiety. Qty: 30 tablet, Refills: 0     !! - Potential duplicate medications found. Please discuss with provider.    CONTINUE these medications which have NOT CHANGED   Details  acetaminophen (TYLENOL) 500 MG tablet Take 500 mg by mouth daily at 12 noon.    !! ALPRAZolam (XANAX) 0.25 MG tablet Take 0.25 mg by mouth daily as needed for anxiety.     denosumab (PROLIA) 60 MG/ML SOLN injection Inject 60 mg into the skin every 6 (six) months.     isosorbide mononitrate (IMDUR) 30 MG 24 hr tablet Take 30 mg by mouth daily.    metoCLOPramide (REGLAN) 10 MG tablet Take 10 mg by mouth 4 (four) times daily -  before meals and at bedtime.     metoprolol succinate (TOPROL-XL) 100 MG 24 hr tablet Take  100 mg by mouth 2 (two) times daily.    naproxen sodium (ANAPROX) 220 MG tablet Take 220 mg by mouth 2 (two) times daily.    nitroGLYCERIN (NITROSTAT) 0.4 MG SL tablet Place 0.4 mg under the tongue every 5 (five) minutes as needed for chest pain.    Vitamin D, Ergocalciferol, (DRISDOL) 50000 units CAPS capsule Take 50,000 Units by mouth every 7 (seven) days. Pt takes on Wednesday.    zolpidem (AMBIEN) 5 MG tablet Take 5 mg  by mouth at bedtime.     !! - Potential duplicate medications found. Please discuss with provider.       DISCHARGE INSTRUCTIONS:   DIET:  Cardiac diet  DISCHARGE CONDITION:  Stable  ACTIVITY:  Activity as tolerated  OXYGEN:  Home Oxygen: No.   Oxygen Delivery: room air  DISCHARGE LOCATION:  nursing home   If you experience worsening of your admission symptoms, develop shortness of breath, life threatening emergency, suicidal or homicidal thoughts you must seek medical attention immediately by calling 911 or calling your MD immediately  if symptoms less severe.  You Must read complete instructions/literature along with all the possible adverse reactions/side effects for all the Medicines you take and that have been prescribed to you. Take any new Medicines after you have completely understood and accpet all the possible adverse reactions/side effects.   Please note  You were cared for by a hospitalist during your hospital stay. If you have any questions about your discharge medications or the care you received while you were in the hospital after you are discharged, you can call the unit and asked to speak with the hospitalist on call if the hospitalist that took care of you is not available. Once you are discharged, your primary care physician will handle any further medical issues. Please note that NO REFILLS for any discharge medications will be authorized once you are discharged, as it is imperative that you return to your primary care physician (or establish a relationship with a primary care physician if you do not have one) for your aftercare needs so that they can reassess your need for medications and monitor your lab values.     Today   Still has some right hand/arm weakness but improved. No other complaints. Noted to be tachycardic but ECG showing sinus tach and pt. Did not get her metoprolol and I will resume that.   VITAL SIGNS:  Blood pressure 141/66, pulse 114,  temperature 98.5 F (36.9 C), temperature source Oral, resp. rate 20, height 5\' 7"  (1.702 m), weight 68.493 kg (151 lb), SpO2 97 %.  I/O:   Intake/Output Summary (Last 24 hours) at 11/29/15 1059 Last data filed at 11/29/15 0955  Gross per 24 hour  Intake    480 ml  Output      0 ml  Net    480 ml    PHYSICAL EXAMINATION:   GENERAL: 82 y.o.-year-old patient lying in the bed in NAD.  EYES: Pupils equal, round, reactive to light and accommodation. No scleral icterus. Extraocular muscles intact.  HEENT: Head atraumatic, normocephalic. Oropharynx and nasopharynx clear.  NECK: Supple, no jugular venous distention. No thyroid enlargement, no tenderness.  LUNGS: Normal breath sounds bilaterally, no wheezing, rales, rhonchi. No use of accessory muscles of respiration.  CARDIOVASCULAR: S1, S2 RRR (Tachycardic). No murmurs, rubs, or gallops.  ABDOMEN: Soft, nontender, nondistended. Bowel sounds present. No organomegaly or mass.  EXTREMITIES: No cyanosis, clubbing or edema b/l.  NEUROLOGIC: Cranial nerves II through XII  are intact. Right Hand/arm weakness 4/5 strength. No other focal motor or sensory deficits appreciated b/l.  PSYCHIATRIC: The patient is alert and oriented x 3.  SKIN: No obvious rash, lesion, or ulcer.   DATA REVIEW:   CBC  Recent Labs Lab 11/28/15 0452  WBC 7.8  HGB 12.7  HCT 38.1  PLT 241    Chemistries   Recent Labs Lab 11/29/15 0549  NA 132*  K 3.4*  CL 99*  CO2 28  GLUCOSE 101*  BUN 9  CREATININE 0.68  CALCIUM 8.5*    Cardiac Enzymes No results for input(s): TROPONINI in the last 168 hours.  RADIOLOGY:  Ct Head Wo Contrast  11/27/2015  CLINICAL DATA:  Right hand weakness and numbness since yesterday. EXAM: CT HEAD WITHOUT CONTRAST TECHNIQUE: Contiguous axial images were obtained from the base of the skull through the vertex without intravenous contrast. COMPARISON:  MRI brain 06/14/2009 FINDINGS: Stable age related cerebral  atrophy, ventriculomegaly and periventricular white matter disease. No extra-axial fluid collections are identified. No CT findings for acute hemispheric infarction or intracranial hemorrhage. No mass lesions. The brainstem and cerebellum are normal. The bony structures are intact. No fracture or bone lesion. The paranasal sinuses and mastoid air cells are clear. The globes are intact. Stable vascular calcifications. IMPRESSION: Age related cerebral atrophy, ventriculomegaly and periventricular white matter disease. No acute intracranial findings or mass lesions. Electronically Signed   By: Rudie Meyer M.D.   On: 11/27/2015 13:00   Mr Brain Wo Contrast  11/27/2015  CLINICAL DATA:  82 year old female with right side weakness. Right hand numbness. Generalized weakness. Symptoms for 1.5 days. Initial encounter. EXAM: MRI HEAD WITHOUT CONTRAST TECHNIQUE: Multiplanar, multiecho pulse sequences of the brain and surrounding structures were obtained without intravenous contrast. COMPARISON:  Head CT without contrast 1241 hours today. Brain MRI 06/14/2009. FINDINGS: Small foci of cortical and subcortical restricted diffusion along the superior lateral left peri-Rolandic gyrus (series 100, images 40 and 42). This is at the right upper extremity representation area. Mild associated T2 and FLAIR hyperintensity with no evidence of associated hemorrhage. No mass effect. No other left hemisphere restricted diffusion identified. No contralateral right side or posterior fossa restricted diffusion. Major intracranial vascular flow voids are stable since 2010. Cerebral volume is not significantly changed. No midline shift, mass effect, evidence of mass lesion, ventriculomegaly, extra-axial collection or acute intracranial hemorrhage. Cervicomedullary junction and pituitary are within normal limits. Negative for age visualized cervical spine. Scattered and patchy bilateral cerebral white matter T2 and FLAIR hyperintensity outside  of the acute findings has mildly progressed since 2010. No cortical encephalomalacia identified. However, chronic lacunar infarcts of the basal ganglia and thalami (left greater than right) have progressed. Brainstem and cerebellum remain normal for age. Mastoids and paranasal sinuses remain clear. Orbit and scalp soft tissues are stable. Normal bone marrow signal. IMPRESSION: 1. Small acute left MCA territory infarcts about the left motor strip in the area of right upper extremity representation. No associated hemorrhage or mass effect. 2. Progressed small vessel ischemia in the left deep gray matter nuclei since 2010. Electronically Signed   By: Odessa Fleming M.D.   On: 11/27/2015 15:59   US Carotid Bilateral  11/27/2015  CLINICAL DATA:  Right-sided weakness and hypertension. EXAM: BILATERAL CAROTID DUPLEX ULTRASOUND TECHNIQUE: Wallace Cullens scale imaging, color Doppler and duplex ultrasound were performed of bilateral carotid and vertebral arteries in the neck. COMPARISON:  None. FINDINGS: Criteria: Quantification of carotid stenosis is based on velocity parameters that correlate the  residual internal carotid diameter with NASCET-based stenosis levels, using the diameter of the distal internal carotid lumen as the denominator for stenosis measurement. The following velocity measurements were obtained: RIGHT ICA:  56/16 cm/sec CCA:  50/6 cm/sec SYSTOLIC ICA/CCA RATIO:  1.1 DIASTOLIC ICA/CCA RATIO:  2.7 ECA:  56 cm/sec LEFT ICA:  40/9 cm/sec CCA:  50/10 cm/sec SYSTOLIC ICA/CCA RATIO:  0.8 DIASTOLIC ICA/CCA RATIO:  0.9 ECA:  48 cm/sec RIGHT CAROTID ARTERY: Focal calcified plaque is present at the level of the distal bulb and proximal ICA. Velocities and waveforms are normal and estimated right ICA stenosis is less than 50%. RIGHT VERTEBRAL ARTERY: Antegrade flow with normal waveform and velocity. LEFT CAROTID ARTERY: There is a mild amount of calcified plaque in the proximal left ICA. Velocities and waveforms are normal and  estimated left ICA stenosis is less than 50%. LEFT VERTEBRAL ARTERY: Antegrade flow with normal waveform and velocity. IMPRESSION: Mild amount of plaque bilaterally. Plaque burden at the level of the right distal bulb and proximal right ICA slightly greater then in the left ICA. Estimated bilateral ICA stenoses are less than 50%. Electronically Signed   By: Irish Lack M.D.   On: 11/27/2015 15:53      Management plans discussed with the patient, family and they are in agreement.  CODE STATUS:     Code Status Orders        Start     Ordered   11/27/15 1351  Full code   Continuous     11/27/15 1355    Code Status History    Date Active Date Inactive Code Status Order ID Comments User Context   This patient has a current code status but no historical code status.    Advance Directive Documentation        Most Recent Value   Type of Advance Directive  Healthcare Power of Attorney   Pre-existing out of facility DNR order (yellow form or pink MOST form)     "MOST" Form in Place?        TOTAL TIME TAKING CARE OF THIS PATIENT: 40 minutes.    Houston Siren M.D on 11/29/2015 at 10:59 AM  Between 7am to 6pm - Pager - [phone]  After 6pm go to [email or web address] - password [email or web address] [facility] [place]  [facility]  Office  [phone]  CC: Primary care physician; No primary care provider on file.

## 2015-11-29 NOTE — Progress Notes (Signed)
Checked to see if pre-authorization would be needed for non-emergent EMS transport.  Per [employer]’s automated system, [phone], patient has a [employer] Medicare Advantage PPO policy.  [employer] Medicare PPO plans do not require pre-auth for non-emergent ground transports using service codes A0426 or A0428.   °

## 2015-11-29 NOTE — Care Management Important Message (Signed)
Important Message  Patient Details  Name: ANZLEY DIBBERN MRN: [id number] Date of Birth: February 01, 1932   Medicare Important Message Given:  Yes    Olegario Messier A Drayson Dorko 11/29/2015, 11:18 AM

## 2015-11-29 NOTE — Progress Notes (Signed)
PT Cancellation Note  Patient Details Name: TAIMI TOWE MRN: [id number] DOB: [birth year]   Cancelled Treatment:    Reason Eval/Treat Not Completed: Medical issues which prohibited therapy (Chart reviewed for planned treatment session; patient noted with recently completed STAT EKG secondary to tachycardia.  HR currently 140-150s at rest; RN informed/aware and planning to give newly scheduled metoprolol.  Will hold PT at this time and re-attempt at later time/date as medically appropriate.)   Rigo Letts H. Manson Passey, PT, DPT, NCS 11/29/2015, 11:11 AM [phone]

## 2015-11-29 NOTE — Progress Notes (Signed)
Report called to [place] at [facility].  Pt daughter to transport via car to [facility].  Daughter given packet with Rx to give to [facility] staff.  Social worker sent discharge instructions and orders.  Orson Ape, RN

## 2016-05-11 DEATH — deceased

## 2017-02-27 IMAGING — MR MR HEAD W/O CM
10 series · 48 of 48 positions shown · non-contrast
Comparison: Head CT without contrast 3643 hours today. Brain MRI
06/14/2009.

CLINICAL DATA: 83-year-old female with right side weakness. Right
hand numbness. Generalized weakness. Symptoms for 1.5 days. Initial
encounter.

EXAM:
MRI HEAD WITHOUT CONTRAST
TECHNIQUE: Multiplanar, multiecho pulse sequences of the brain and surrounding
structures were obtained without intravenous contrast.

[Series 2: T1 · sagittal · 5.0mm · 0.45mm/px · 4 of 26 slices shown]
[im 1/26]
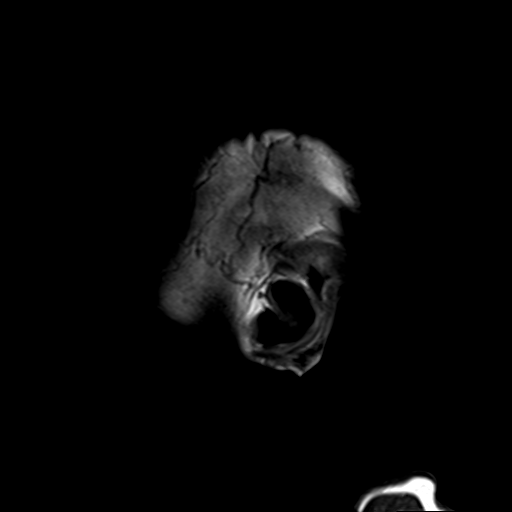
[im 9/26]
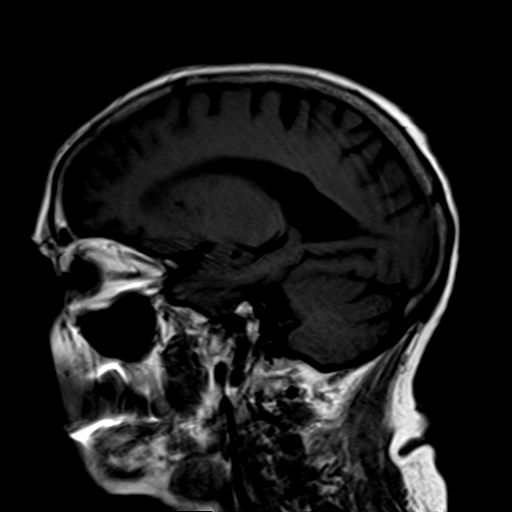
[im 17/26]
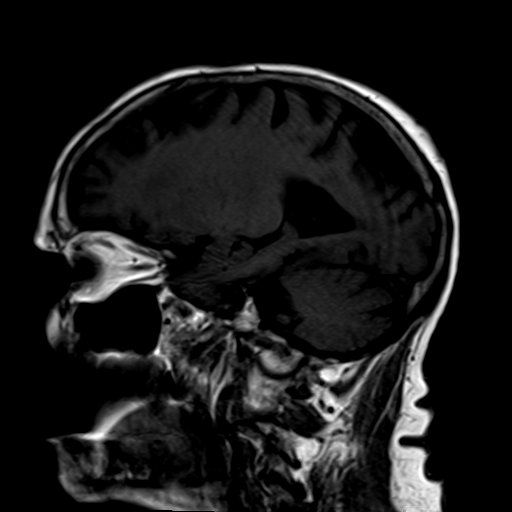
[im 26/26]
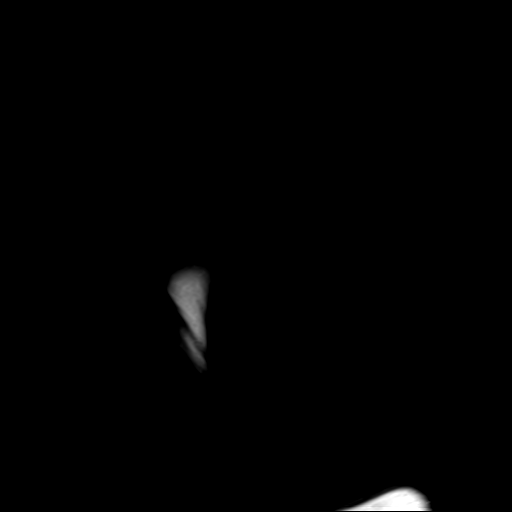

[Series 4: DWI · axial · 3.0mm · 1.80mm/px · z∈[-27,+135]mm · 8 of 55 slices shown (1 of 4)]
[im 1/55]
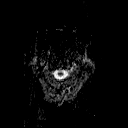
[im 8/55]
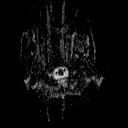
[im 16/55]
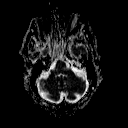
[im 24/55]
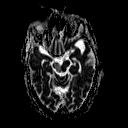
[im 31/55]
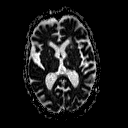
[im 39/55]
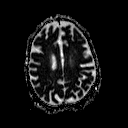
[im 47/55]
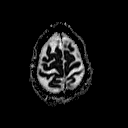
[im 55/55]
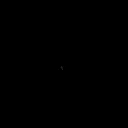

[Series 6: DWI · coronal · 3.0mm · 1.80mm/px · 7 of 50 slices shown (2 of 4)]
[im 1/50]
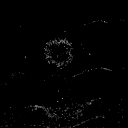
[im 9/50]
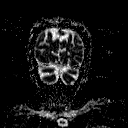
[im 17/50]
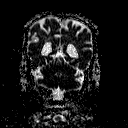
[im 25/50]
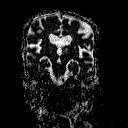
[im 33/50]
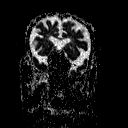
[im 41/50]
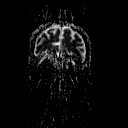
[im 50/50]
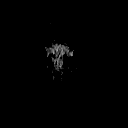

[Series 7: T2 · axial · 5.0mm · 0.60mm/px · z∈[-29,+133]mm · 3 of 26 slices shown (1 of 3)]
[im 1/26]
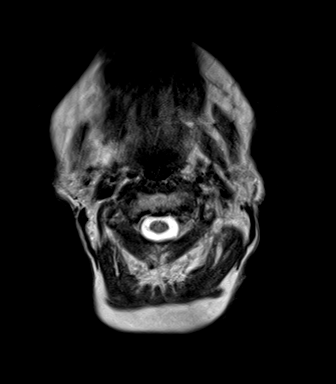
[im 13/26]
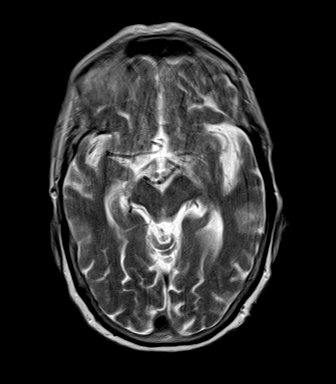
[im 26/26]
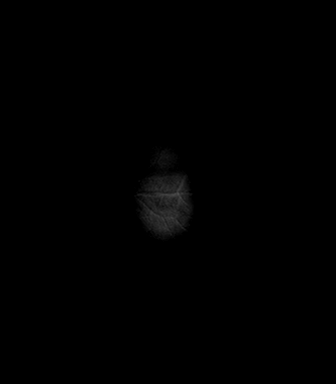

[Series 8: FLAIR · axial · 5.0mm · 0.45mm/px · z∈[-29,+133]mm · 3 of 26 slices shown]
[im 1/26]
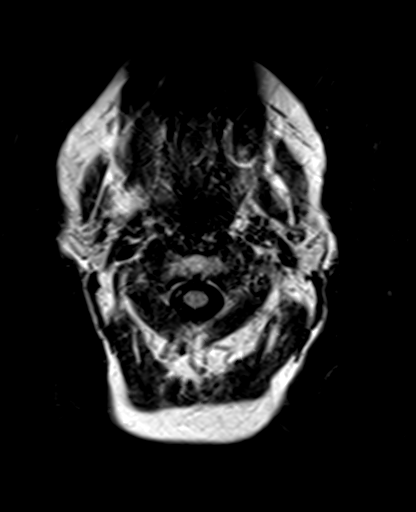
[im 13/26]
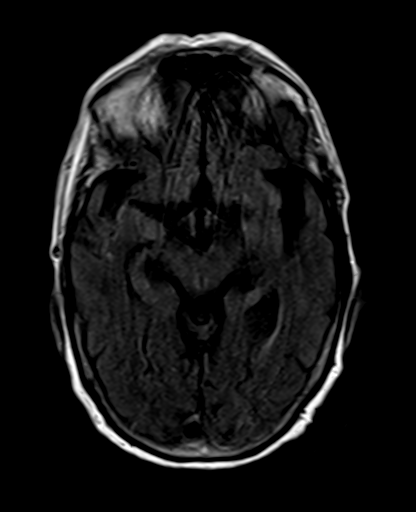
[im 26/26]
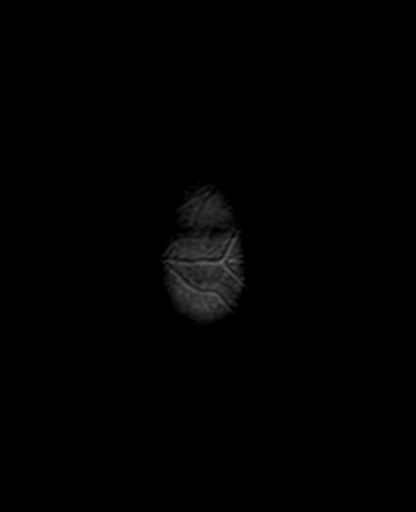

[Series 9: T2 · axial · 5.0mm · 1.20mm/px · z∈[-28,+134]mm · 3 of 26 slices shown (2 of 3)]
[im 1/26]
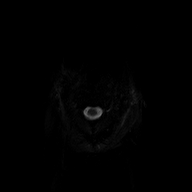
[im 13/26]
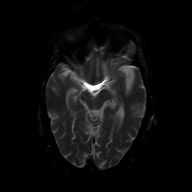
[im 26/26]
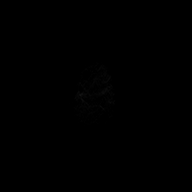

[Series 10: GRE · axial · 5.0mm · 0.45mm/px · z∈[-28,+134]mm · 3 of 26 slices shown]
[im 1/26]
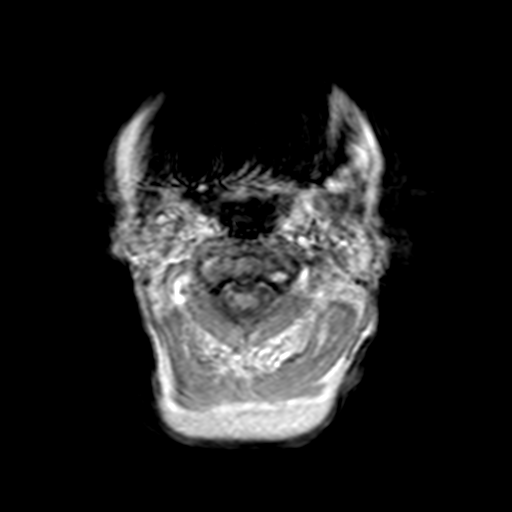
[im 13/26]
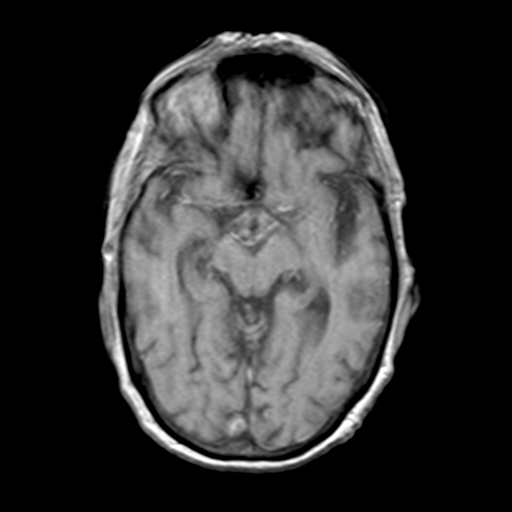
[im 26/26]
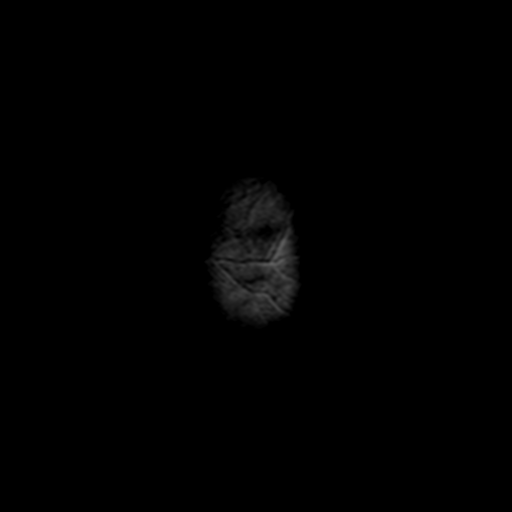

[Series 11: T2 · coronal · 5.0mm · 0.45mm/px · 4 of 30 slices shown (3 of 3)]
[im 1/30]
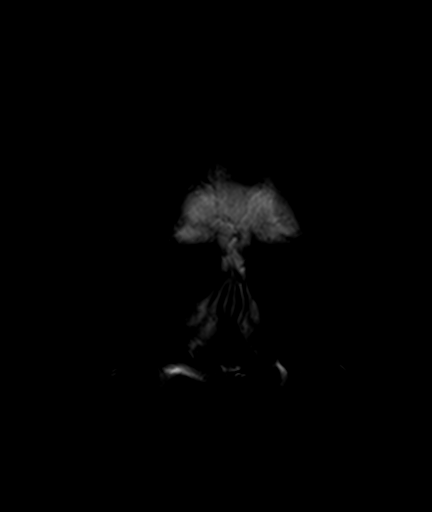
[im 10/30]
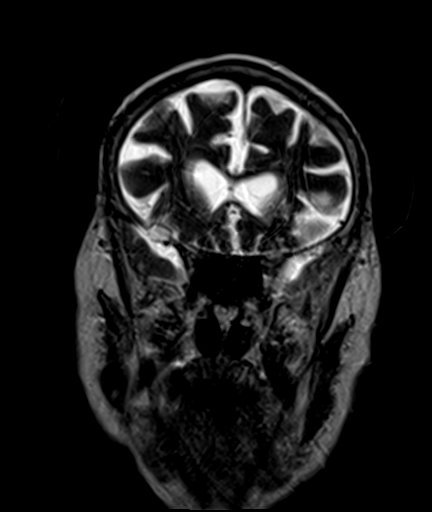
[im 20/30]
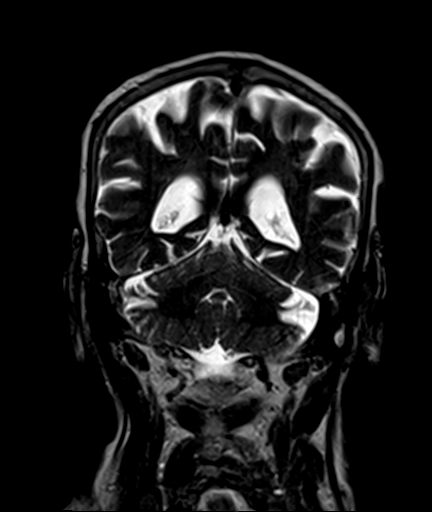
[im 30/30]
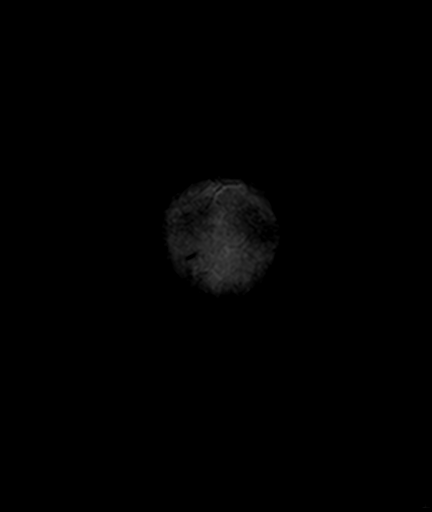

[Series 100: DWI · axial · 3.0mm · 1.80mm/px · z∈[-27,+135]mm · 7 of 54 slices shown (3 of 4)]
[im 1/54]
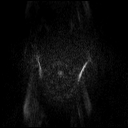
[im 9/54]
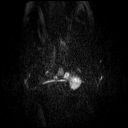
[im 18/54]
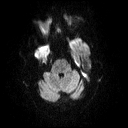
[im 27/54]
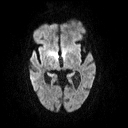
[im 36/54]
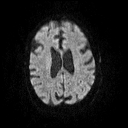
[im 45/54]
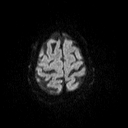
[im 54/54]
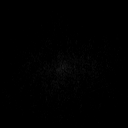

[Series 101: DWI · coronal · 3.0mm · 1.80mm/px · 6 of 48 slices shown (4 of 4)]
[im 1/48]
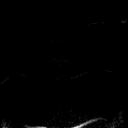
[im 10/48]
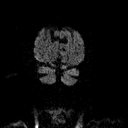
[im 19/48]
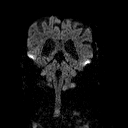
[im 29/48]
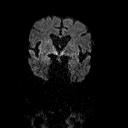
[im 38/48]
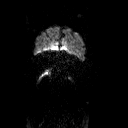
[im 48/48]
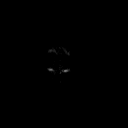

[48 of 48 positions shown; findings below may reference images not displayed]

FINDINGS: Small foci of cortical and subcortical restricted diffusion along
the superior lateral left peri-Rolandic gyrus (series 100, images 40
and 42). This is at the right upper extremity representation area.
Mild associated T2 and FLAIR hyperintensity with no evidence of
associated hemorrhage. No mass effect.

No other left hemisphere restricted diffusion identified. No
contralateral right side or posterior fossa restricted diffusion.
Major intracranial vascular flow voids are stable since 5626.

Cerebral volume is not significantly changed. No midline shift, mass
effect, evidence of mass lesion, ventriculomegaly, extra-axial
collection or acute intracranial hemorrhage. Cervicomedullary
junction and pituitary are within normal limits. Negative for age
visualized cervical spine.

Scattered and patchy bilateral cerebral white matter T2 and FLAIR
hyperintensity outside of the acute findings has mildly progressed
since 5626. No cortical encephalomalacia identified. However,
chronic lacunar infarcts of the basal ganglia and thalami (left
greater than right) have progressed. Brainstem and cerebellum remain
normal for age.

Mastoids and paranasal sinuses remain clear. Orbit and scalp soft
tissues are stable. Normal bone marrow signal.
IMPRESSION: 1. Small acute left MCA territory infarcts about the left motor
strip in the area of right upper extremity representation. No
associated hemorrhage or mass effect.
2. Progressed small vessel ischemia in the left deep gray matter
nuclei since [DATE].
